# Patient Record
Sex: Male | Born: 1970 | Race: Black or African American | Hispanic: Refuse to answer | Marital: Single | Smoking: Current every day smoker
Health system: Southern US, Community
[De-identification: ages and names within clinical notes are randomized; demographics above are authoritative.]

---

## 1998-01-20 ENCOUNTER — Emergency Department (HOSPITAL_COMMUNITY): Admission: EM | Admit: 1998-01-20 | Discharge: 1998-01-20 | Payer: Self-pay | Admitting: Emergency Medicine

## 1998-01-20 ENCOUNTER — Encounter: Payer: Self-pay | Admitting: Emergency Medicine

## 1998-03-05 ENCOUNTER — Emergency Department (HOSPITAL_COMMUNITY): Admission: EM | Admit: 1998-03-05 | Discharge: 1998-03-05 | Payer: Self-pay | Admitting: Emergency Medicine

## 1998-07-21 ENCOUNTER — Emergency Department (HOSPITAL_COMMUNITY): Admission: EM | Admit: 1998-07-21 | Discharge: 1998-07-21 | Payer: Self-pay | Admitting: Emergency Medicine

## 2000-01-26 ENCOUNTER — Emergency Department (HOSPITAL_COMMUNITY): Admission: EM | Admit: 2000-01-26 | Discharge: 2000-01-26 | Payer: Self-pay | Admitting: Emergency Medicine

## 2000-01-26 ENCOUNTER — Encounter: Payer: Self-pay | Admitting: Emergency Medicine

## 2002-01-07 ENCOUNTER — Encounter: Payer: Self-pay | Admitting: Emergency Medicine

## 2002-01-07 ENCOUNTER — Emergency Department (HOSPITAL_COMMUNITY): Admission: EM | Admit: 2002-01-07 | Discharge: 2002-01-07 | Payer: Self-pay | Admitting: Emergency Medicine

## 2003-09-24 ENCOUNTER — Emergency Department (HOSPITAL_COMMUNITY): Admission: EM | Admit: 2003-09-24 | Discharge: 2003-09-24 | Payer: Self-pay | Admitting: Emergency Medicine

## 2004-02-13 ENCOUNTER — Emergency Department (HOSPITAL_COMMUNITY): Admission: EM | Admit: 2004-02-13 | Discharge: 2004-02-13 | Payer: Self-pay | Admitting: Family Medicine

## 2004-05-23 ENCOUNTER — Emergency Department (HOSPITAL_COMMUNITY): Admission: EM | Admit: 2004-05-23 | Discharge: 2004-05-24 | Payer: Self-pay | Admitting: Emergency Medicine

## 2005-07-28 ENCOUNTER — Emergency Department (HOSPITAL_COMMUNITY): Admission: EM | Admit: 2005-07-28 | Discharge: 2005-07-28 | Payer: Self-pay | Admitting: Family Medicine

## 2006-05-25 ENCOUNTER — Emergency Department (HOSPITAL_COMMUNITY): Admission: EM | Admit: 2006-05-25 | Discharge: 2006-05-25 | Payer: Self-pay | Admitting: Emergency Medicine

## 2007-05-05 ENCOUNTER — Emergency Department (HOSPITAL_COMMUNITY): Admission: EM | Admit: 2007-05-05 | Discharge: 2007-05-05 | Payer: Self-pay | Admitting: Emergency Medicine

## 2007-11-23 ENCOUNTER — Emergency Department (HOSPITAL_COMMUNITY): Admission: EM | Admit: 2007-11-23 | Discharge: 2007-11-23 | Payer: Self-pay | Admitting: Emergency Medicine

## 2007-11-29 ENCOUNTER — Emergency Department (HOSPITAL_COMMUNITY): Admission: EM | Admit: 2007-11-29 | Discharge: 2007-11-29 | Payer: Self-pay | Admitting: Emergency Medicine

## 2009-05-11 ENCOUNTER — Emergency Department (HOSPITAL_COMMUNITY): Admission: EM | Admit: 2009-05-11 | Discharge: 2009-05-11 | Payer: Self-pay | Admitting: Emergency Medicine

## 2009-11-06 ENCOUNTER — Emergency Department (HOSPITAL_COMMUNITY): Admission: EM | Admit: 2009-11-06 | Discharge: 2009-11-06 | Payer: Self-pay | Admitting: Emergency Medicine

## 2012-04-15 ENCOUNTER — Encounter (HOSPITAL_COMMUNITY): Payer: Self-pay

## 2012-04-15 ENCOUNTER — Emergency Department (HOSPITAL_COMMUNITY): Payer: Self-pay

## 2012-04-15 ENCOUNTER — Emergency Department (HOSPITAL_COMMUNITY)
Admission: EM | Admit: 2012-04-15 | Discharge: 2012-04-15 | Disposition: A | Discharge status: Home / Self Care | Payer: Self-pay | Attending: Emergency Medicine | Admitting: Emergency Medicine

## 2012-04-15 DIAGNOSIS — F17200 Nicotine dependence, unspecified, uncomplicated: Secondary | ICD-10-CM | POA: Insufficient documentation

## 2012-04-15 DIAGNOSIS — M25469 Effusion, unspecified knee: Secondary | ICD-10-CM | POA: Insufficient documentation

## 2012-04-15 DIAGNOSIS — Z79899 Other long term (current) drug therapy: Secondary | ICD-10-CM | POA: Insufficient documentation

## 2012-04-15 DIAGNOSIS — M25462 Effusion, left knee: Secondary | ICD-10-CM

## 2012-04-15 LAB — SYNOVIAL CELL COUNT + DIFF, W/ CRYSTALS: Crystals, Fluid: NONE SEEN

## 2012-04-15 LAB — GRAM STAIN

## 2012-04-15 LAB — GLUCOSE, SYNOVIAL FLUID: Glucose, Synovial Fluid: <20 mg/dL

## 2012-04-15 LAB — GLUCOSE, CAPILLARY: Glucose-Capillary: 98 mg/dL (ref 70–99)

## 2012-04-15 MED ORDER — HYDROCODONE-ACETAMINOPHEN 5-325 MG PO TABS
1.0000 | ORAL_TABLET | Freq: Once | ORAL | Status: AC
  Administered 2012-04-15: 1 via ORAL
  Filled 2012-04-15: qty 1

## 2012-04-15 MED ORDER — NAPROXEN 500 MG PO TABS: 500.0000 mg | ORAL_TABLET | Freq: Two times a day (BID) | ORAL | Status: DC

## 2012-04-15 MED ORDER — HYDROCODONE-ACETAMINOPHEN 5-325 MG PO TABS: 1.0000 | ORAL_TABLET | Freq: Four times a day (QID) | ORAL | Status: DC | PRN

## 2012-04-15 NOTE — ED Notes (Signed)
Pt returned from xray

## 2012-04-15 NOTE — ED Notes (Signed)
Pt presents with 3 day h/o L knee pain.  Pt denies any injury.  Pt reports knee is swollen, red and warm to touch.

## 2012-04-15 NOTE — ED Notes (Signed)
Ortho called 

## 2012-04-15 NOTE — ED Provider Notes (Signed)
History    CSN: [id number] Arrival date & time 04/15/12  1150 First MD Initiated Contact with Patient 04/15/12 1214      CC: knee pain  Patient is a 42 y.o. male presenting with knee pain. The history is provided by the patient.  Knee Pain This is a new problem. The current episode started more than 2 days ago. The problem occurs constantly. The problem has been gradually worsening. Pertinent negatives include no chest pain, no abdominal pain, no headaches and no shortness of breath. The symptoms are aggravated by walking. Nothing relieves the symptoms. He has tried a cold compress for the symptoms. The treatment provided no relief.  Pt noticed a small amount of redness and swelling a few days ago.  Since then time the redness has increased and progressed.  No fevers. History reviewed. No pertinent past medical history.  History reviewed. No pertinent past surgical history.  History reviewed. No pertinent family history.  History  Substance Use Topics  . Smoking status: Current Every Day Smoker -- 0.5 packs/day  . Smokeless tobacco: Not on file  . Alcohol Use: Yes      Review of Systems  Constitutional: Negative for fever.  Respiratory: Negative for shortness of breath.   Cardiovascular: Negative for chest pain.  Gastrointestinal: Negative for abdominal pain.  Skin: Negative for rash.  Neurological: Negative for headaches.  All other systems reviewed and are negative.    Allergies  Review of patient's allergies indicates no known allergies.  Home Medications  No current outpatient prescriptions on file.  BP 134/86  Pulse 70  Temp 98.2 F (36.8 C) (Oral)  Resp 18  SpO2 98%  Physical Exam  Nursing note and vitals reviewed. Constitutional: He appears well-developed and well-nourished. No distress.  HENT:  Head: Normocephalic and atraumatic.  Right Ear: External ear normal.  Left Ear: External ear normal.  Eyes: Conjunctivae normal are normal. Right eye exhibits  no discharge. Left eye exhibits no discharge. No scleral icterus.  Neck: Neck supple. No tracheal deviation present.  Cardiovascular: Normal rate and regular rhythm.   Pulmonary/Chest: Effort normal and breath sounds normal. No stridor. No respiratory distress. He has no wheezes.  Musculoskeletal: He exhibits no edema.       Left knee: He exhibits swelling and effusion. He exhibits no erythema. tenderness found.       Patient is able to flex and extend his knee although this is limited because of his discomfort, no increased warmth noted of the left knee joint, no erythema; the rest of the extremity is unremarkable  Neurological: He is alert. Cranial nerve deficit: no gross deficits.  Skin: Skin is warm and dry. No rash noted.  Psychiatric: He has a normal mood and affect.    ED Course  ARTHOCENTESIS Performed by: Celene Kras Authorized by: Celene Kras Consent: Verbal consent obtained. Written consent not obtained. Risks and benefits: risks, benefits and alternatives were discussed Consent given by: patient Patient understanding: patient states understanding of the procedure being performed Patient identity confirmed: verbally with patient Time out: Immediately prior to procedure a "time out" was called to verify the correct patient, procedure, equipment, support staff and site/side marked as required. Indications: joint swelling  Body area: knee Joint: left knee Local anesthesia used: yes Anesthesia: local infiltration Local anesthetic: lidocaine 1% without epinephrine Anesthetic total: 2 ml Patient sedated: no Preparation: Patient was prepped and draped in the usual sterile fashion. Needle gauge: 18 G Approach: medial Aspirate: yellow and clear  Aspirate amount: 25 ml Patient tolerance: Patient tolerated the procedure well with no immediate complications.    Labs Reviewed  CELL COUNT + DIFF,  W/ CRYST-SYNVL FLD - Abnormal; Notable for the following:    Color, Synovial  YELLOW (*)     Appearance-Synovial HAZY (*)     WBC, Synovial 470 (*)     All other components within normal limits  GLUCOSE, CAPILLARY  GRAM STAIN  BODY FLUID CULTURE  GLUCOSE, SYNOVIAL FLUID   Dg Knee Complete 4 Views Left  04/15/2012  *RADIOLOGY REPORT*  Clinical Data: Swelling knee pain.  Anterior knee pain.  No injury.  LEFT KNEE - COMPLETE 4+ VIEW  Comparison: None.  Findings: Anatomic alignment of the left knee.  The effusion is present.  No fracture.  Joint spaces are preserved.  IMPRESSION: Knee effusion without acute osseous abnormality.   Original Report Authenticated By: Andreas Newport, M.D.     Diagnosis: Knee effusion   MDM  Initial fluid analysis does not suggest infection.  White blood cells are noted but only 470 and no bacteria on Gram stain. No crystals noted.  Most likely related to some type of internal derangement ie meniscus.  Will dc home with crutches.  Follow up with orthopedist        Celene Kras, MD 04/15/12 1743

## 2012-04-15 NOTE — ED Notes (Signed)
Discharge instructions reviewed. Pt verbalized understanding.  

## 2012-04-15 NOTE — ED Notes (Signed)
Pt c/o pain in lt knee x 3 days. Pt states " Nothing work, I have tried ice and aspirin nothing works". Pt states he didn't injure knee. Visible swelling, and warm to touch. Pt rates pain 10/10.

## 2012-04-15 NOTE — ED Notes (Signed)
Patient transported to X-ray 

## 2012-04-15 NOTE — Progress Notes (Signed)
Orthopedic Tech Progress Note Patient Details:  Brett Holden 1970/11/08 [id number]  Ortho Devices Type of Ortho Device: Crutches;Knee Immobilizer Ortho Device/Splint Location: (L) LE Ortho Device/Splint Interventions: Application   [name] 04/15/2012, 4:44 PM

## 2012-04-18 LAB — BODY FLUID CULTURE

## 2014-10-04 ENCOUNTER — Emergency Department (HOSPITAL_COMMUNITY)
Admission: EM | Admit: 2014-10-04 | Discharge: 2014-10-04 | Disposition: A | Discharge status: Home / Self Care | Payer: 59 | Attending: Emergency Medicine | Admitting: Emergency Medicine

## 2014-10-04 ENCOUNTER — Encounter (HOSPITAL_COMMUNITY): Payer: Self-pay | Admitting: Emergency Medicine

## 2014-10-04 DIAGNOSIS — M79671 Pain in right foot: Secondary | ICD-10-CM | POA: Diagnosis present

## 2014-10-04 DIAGNOSIS — Z720 Tobacco use: Secondary | ICD-10-CM | POA: Diagnosis not present

## 2014-10-04 DIAGNOSIS — M722 Plantar fascial fibromatosis: Secondary | ICD-10-CM | POA: Diagnosis not present

## 2014-10-04 DIAGNOSIS — Z791 Long term (current) use of non-steroidal anti-inflammatories (NSAID): Secondary | ICD-10-CM | POA: Diagnosis not present

## 2014-10-04 MED ORDER — IBUPROFEN 800 MG PO TABS: 800.0000 mg | ORAL_TABLET | Freq: Three times a day (TID) | ORAL | Status: DC

## 2014-10-04 NOTE — Discharge Instructions (Signed)
Plantar Fasciitis °Plantar fasciitis is a common condition that causes foot pain. It is soreness (inflammation) of the band of tough fibrous tissue on the bottom of the foot that runs from the heel bone (calcaneus) to the ball of the foot. The cause of this soreness may be from excessive standing, poor fitting shoes, running on hard surfaces, being overweight, having an abnormal walk, or overuse (this is common in runners) of the painful foot or feet. It is also common in aerobic exercise dancers and ballet dancers. °SYMPTOMS  °Most people with plantar fasciitis complain of: °· Severe pain in the morning on the bottom of their foot especially when taking the first steps out of bed. This pain recedes after a few minutes of walking. °· Severe pain is experienced also during walking following a long period of inactivity. °· Pain is worse when walking barefoot or up stairs °DIAGNOSIS  °· Your caregiver will diagnose this condition by examining and feeling your foot. °· Special tests such as X-rays of your foot, are usually not needed. °PREVENTION  °· Consult a sports medicine professional before beginning a new exercise program. °· Walking programs offer a good workout. With walking there is a lower chance of overuse injuries common to runners. There is less impact and less jarring of the joints. °· Begin all new exercise programs slowly. If problems or pain develop, decrease the amount of time or distance until you are at a comfortable level. °· Wear good shoes and replace them regularly. °· Stretch your foot and the heel cords at the back of the ankle (Achilles tendon) both before and after exercise. °· Run or exercise on even surfaces that are not hard. For example, asphalt is better than pavement. °· Do not run barefoot on hard surfaces. °· If using a treadmill, vary the incline. °· Do not continue to workout if you have foot or joint problems. Seek professional help if they do not improve. °HOME CARE INSTRUCTIONS    °· Avoid activities that cause you pain until you recover. °· Use ice or cold packs on the problem or painful areas after working out. °· Only take over-the-counter or prescription medicines for pain, discomfort, or fever as directed by your caregiver. °· Soft shoe inserts or athletic shoes with air or gel sole cushions may be helpful. °· If problems continue or become more severe, consult a sports medicine caregiver or your own health care provider. Cortisone is a potent anti-inflammatory medication that may be injected into the painful area. You can discuss this treatment with your caregiver. °MAKE SURE YOU:  °· Understand these instructions. °· Will watch your condition. °· Will get help right away if you are not doing well or get worse. °Document Released: 11/26/2000 Document Revised: 05/26/2011 Document Reviewed: 01/26/2008 °ExitCare® Patient Information ©2015 [employer]. This information is not intended to replace advice given to you by your health care provider. Make sure you discuss any questions you have with your health care provider. ° ° °Emergency Department Resource Guide °1) Find a Doctor and Pay Out of Pocket °Although you won't have to find out who is covered by your insurance plan, it is a good idea to ask around and get recommendations. You will then need to call the office and see if the doctor you have chosen will accept you as a new patient and what types of options they offer for patients who are self-pay. Some doctors offer discounts or will set up payment plans for their patients who   do not have insurance, but you will need to ask so you aren't surprised when you get to your appointment.  2) Contact Your Local Health Department Not all health departments have doctors that can see patients for sick visits, but many do, so it is worth a call to see if yours does. If you don't know where your local health department is, you can check in your phone book. The CDC also has a tool to help you  locate your state's health department, and many state websites also have listings of all of their local health departments.  3) Find a Walk-in Clinic If your illness is not likely to be very severe or complicated, you may want to try a walk in clinic. These are popping up all over the country in pharmacies, drugstores, and shopping centers. They're usually staffed by nurse practitioners or physician assistants that have been trained to treat common illnesses and complaints. They're usually fairly quick and inexpensive. However, if you have serious medical issues or chronic medical problems, these are probably not your best option.  No Primary Care Doctor: - Call Health Connect at  [phone] - they can help you locate a primary care doctor that  accepts your insurance, provides certain services, etc. - Physician Referral Service- [phone]  Chronic Pain Problems: Organization         Address  Phone   Notes  [facility]  [phone] Patients need to be referred by their primary care doctor.   Medication Assistance: Organization         Address  Phone   Notes  [facility] [address] [place] [place], [place] [address] [phone] --Must be a resident of [facility] -- Must have NO insurance coverage whatsoever (no Medicaid/ Medicare, etc.) -- The pt. MUST have a primary care doctor that directs their care regularly and follows them in the community   [facility]  [phone]   [employer]  [phone]    Agencies that provide inexpensive medical care: Organization         Address  Phone   Notes  [facility]  [phone]   [facility]    [phone]   [facility] [address] [place], [place] [address] [phone]   [facility] [address], [place] [phone]   [facility]anned Parenthood    [phone]   [facility]    [phone]   [facility]  [address], [place] Phone:  [phone], Fax:  [phone] Hours of Operation:  9 am - 6 pm, M-F.  Also accepts Medicaid/Medicare and self-pay.  [facility]  [address], [place] Phone: [phone], Fax: [phone]. Hours of Operation:  8:30 am - 5:30 pm, M-F.  Also accepts Medicaid and self-pay.  [facility] [place] [address] [place] Phone: [phone]   [facility] [address] Natasha Bence [place], [place] [phone], Ext. [phone] Mondays & Thursdays: 7-9 AM.  First 15 patients are seen on a first come, first serve basis.    Medicaid-accepting [facility] Providers:  Organization         Address  Phone   Notes  [facility] [address], [place] [phone] Also accepts self-pay patients.  [facility]  [address], [place] ° ([phone]) [phone]   °[facility] [address], [place] (336) [phone]   °[facility] [address] [place] [address], [place] (336) [phone]   °Veita Bland [address], [place]  ° ([phone]) [phone] Only accepts [place] Access Medicaid patients after they have their name applied to their card.  ° °Self-Pay (no insurance) in [place]: ° °Organization         Address  Phone   Notes  °Sickle Cell Patients, [facility] [address], [place] (336) [phone]   °[facility] [address], [place] (336) [phone]   ° [facility] ° [address], [place] (336) [phone]   °[facility]/Dr. Osei-Bonsu ° [address] [place] [address], [place] or [address], [place] ([phone]) [phone] Phone number for both [place] and [place] locations is the same.  °[facility] [address],  [place] ([phone]) [phone]   °[facility] [address] [place] [address], [place] or [address] (336) [phone] °(336) 878-2260   °[facility] [address], [place] (336) 350-1642, phone; ([phone]) [phone], fax Sees patients 1st and 3rd Saturday of every month.  Must not qualify for public or private insurance (i.e. Medicaid, Medicare, [place] Health Choice, Veterans' Benefits) • Household income should be no more than 200% of the poverty level •The clinic cannot treat you if you are pregnant or think you are pregnant • Sexually transmitted diseases are not treated at the clinic.  ° ° °Dental Care: °Organization         Address  Phone  Notes  °[facility] [address], [place] (336) [phone] Accepts children up to age 21 who are enrolled in Medicaid or [place] Health Choice; pregnant women with a Medicaid card; and children who have applied for Medicaid or [place] Health Choice, but were declined, whose parents can pay a reduced fee at time of service.  °[facility] [place]  [address], [place] (336) 641-7733 Accepts children up to age 21 who are enrolled in Medicaid or [place] Health Choice; pregnant women with a Medicaid card; and children who have applied for Medicaid or [place] Health Choice, but were declined, whose parents can pay a reduced fee at time of service.  °[facility] ° [address], [place] (336) [phone] Patients are seen by appointment only. Walk-ins are not accepted. [facility] will see patients 18 years of age and older. °Monday - Tuesday (8am-5pm) °Most Wednesdays (8:30-5pm) °$30 per visit, cash only  °[facility] ° [address], [place] (336) [phone] Patients are seen by appointment only. Walk-ins are not accepted. [facility] will see patients 18 years of age and older. °One Wednesday Evening  (Monthly: Volunteer Based).  $30 per visit, cash only  °[facility]  ([phone]) [phone] for adults; Children under age 4, call Graduate Pediatric Dentistry at [phone]. Children aged 4-14, please call [phone]) [phone] to request a pediatric application. ° Dental services are provided in all areas of dental care including fillings, crowns and bridges, complete and partial dentures, implants, gum treatment, root canals, and extractions. Preventive care is also provided. Treatment is provided to both adults and children. °Patients   are selected via a lottery and there is often a waiting list. °  °[facility] [address], °[place] ° (336) 763-8833 [email or web address] °  °[facility] [address], [place], [place] (336)723-1848, Ext. 123 Second and Fourth Thursday of each month, opens at 6:30 AM; Clinic ends at 9 AM.  Patients are seen on a first-come first-served basis, and a limited number are seen during each clinic.  ° °[facility] ° [address], [place], [place] (336) [phone]   Eligibility Requirements °You must have lived in [place], [place], or [place] counties for at least the last three months. °  You cannot be eligible for state or federal sponsored healthcare insurance, including Veterans Administration, Medicaid, or Medicare. °  You generally cannot be eligible for healthcare insurance through your employer.  °  How to apply: °Eligibility screenings are held every Tuesday and Wednesday afternoon from 1:00 pm until 4:00 pm. You do not need an appointment for the interview!  °[facility] [address], [place], [place] 336-631-2330   °[facility]  336-342-8273   °[facility]  336-703-3100   °[facility]  336-570-6415   ° °Behavioral Health Resources in the Community: °Intensive Outpatient Programs °Organization         Address  Phone  Notes  °[place]  [facility] [address], [place], [place] 336-878-6098   °[facility] [address], [place], [place] 336-832-9800   °[facility] [address], [place], [place] ° 336-882-2125   °[facility] [address],  °[place], [place] 1-800-853-5163 or 336-641-4981   °Substance Abuse Resources °Organization         Address  Phone  Notes  °[facility]  336-882-2125   °[facility]  336-784-9470   °[facility]  336-285-9073   °[facility]  336-845-3988   °Residential & Outpatient Substance Abuse Program  1-800-659-3381   °Psychological Services °Organization         Address  Phone  Notes  °[facility]  336- 832-9600   °[facility]  336- 378-7881   °[facility] [address], [place] 1-800-853-5163 or 336-641-4981   ° °Mobile Crisis Teams °Organization         Address  Phone  Notes  °[facility], Mobile Crisis Care Unit  1-877-626-1772   °Assertive °[facility] ° [address] [place], [place] 336-834-9664   °Sharon DeEsch [address] °[place] 336-554-5454   ° °Self-Help/Support Groups °Organization         Address  Phone             Notes  °[facility] - variety of support groups  336- 373-1402 Call for more information  °Narcotics Anonymous (NA), [facility] [address], °[place]  2 meetings at this location  ° °Residential Treatment Programs °Organization         Address  Phone  Notes  °[facility] [address],    °[place]  1-866-801-8205   °[facility] ° [address], [place], [place] 704-293-8524   °[facility] [address], [place] 336-845-3988 Admissions: 8am-3pm M-F  °[facility] [address]    °[place], [place] 336-841-1104   °[facility] [address],  [place], [place] 336-379-7146   °[facility] [address]  °[place], [place] 336-285-9073   °[facility] - Intensive Outpatient [address], [place], [place] 336-852-3033   °[facility] [address]  °[place], [place] 1-877-615-2722 or 336-784-9470   °[facility] ([facility] [address] [place], [place] 336-227-7417 Accepts Medicaid  °[facility] [address]  °[place] 1-800-659-3381 Substance Abuse/Addiction Treatment  ° °[place] Behavioral Health Resources °Organization         Address  Phone  Notes  °[facility]  (888) 581-9988   °Julie Brannon, PhD [address] [place], [place]   (336) 349-5553 or (336) 951-0000   ° Behavioral   [address] °[place], [place] (336) 349-4454   °[facility] [address], [place], [place] (336) 342-8316 Insurance/Medicaid/sponsorship through [facility]  °[facility] [address]                                    [place], [place] (336) 342-8316 Therapy/tele-psych/case  °[facility] [address]  ° [place], [place] (336) 349-2233    °Dr. Arfeen  (336) 349-4544   °[facility] [place]  [employer] [place] [facility] 1) [address], [place] °2) [address], [place] °3)  [address], [place] (336) 349-3220 °(336) 342-7768 ° °(336) 342-8140   °[place] [facility] (336) 342-1394 or (336) 342-3537 (After Hours)    ° ° ° ° °

## 2014-10-04 NOTE — ED Notes (Signed)
C/o bilateral foot pain, dorsal surface. No known injury. Onset this am.

## 2014-10-04 NOTE — ED Provider Notes (Signed)
CSN: [id number]     Arrival date & time 10/04/14  [id number] History  This chart was scribed for non-physician practitioner, Jinny SandersJoseph Koa Zoeller, PA-C, working with Blane OharaJoshua Zavitz, MD by Charline BillsEssence Howell, ED Scribe. This patient was seen in room TR05C/TR05C and the patient's care was started at 10:42 AM.   Chief Complaint  Patient presents with  . Foot Pain   The history is provided by the patient. No language interpreter was used.   HPI Comments: Brett Holden is a 44 y.o. male who presents to the Emergency Department complaining of sudden onset of constant, moderate bilateral plantar foot pain since last night. Pt states that left foot pain started last night and right foot pain started this morning. Pain is exacerbated with walking. No known injury. He denies walking more often but reports standing for long periods of time for work. He also denies numbness/tingling in toes. No treatments tried PTA.   History reviewed. No pertinent past medical history. History reviewed. No pertinent past surgical history. No family history on file. History  Substance Use Topics  . Smoking status: Current Every Day Smoker -- 0.50 packs/day  . Smokeless tobacco: Not on file  . Alcohol Use: Yes    Review of Systems  Musculoskeletal: Positive for arthralgias.  Neurological: Negative for numbness.   Allergies  Review of patient's allergies indicates no known allergies.  Home Medications   Prior to Admission medications   Medication Sig Start Date End Date Taking? Authorizing Provider  gabapentin (NEURONTIN) 600 MG tablet Take 600 mg by mouth once.    Historical Provider, MD  HYDROcodone-acetaminophen (NORCO) 5-325 MG per tablet Take 1-2 tablets by mouth every 6 (six) hours as needed for pain. 04/15/12   Linwood DibblesJon Knapp, MD  ibuprofen (ADVIL,MOTRIN) 800 MG tablet Take 1 tablet (800 mg total) by mouth 3 (three) times daily. 10/04/14   Ladona MowJoe Nirali Magouirk, PA-C  naproxen (NAPROSYN) 500 MG tablet Take 1 tablet (500 mg total) by  mouth 2 (two) times daily. 04/15/12   Linwood DibblesJon Knapp, MD   BP 131/79 mmHg  Pulse 59  Temp(Src) 98.2 F (36.8 C) (Oral)  Resp 18  Ht 6' (1.829 m)  Wt 165 lb (74.844 kg)  BMI 22.37 kg/m2  SpO2 100% Physical Exam  Constitutional: He is oriented to person, place, and time. He appears well-developed and well-nourished. No distress.  HENT:  Head: Normocephalic and atraumatic.  Eyes: Conjunctivae and EOM are normal.  Neck: Neck supple. No tracheal deviation present.  Cardiovascular: Normal rate and intact distal pulses.   Pulses:      Dorsalis pedis pulses are 2+ on the right side, and 2+ on the left side.       Posterior tibial pulses are 2+ on the right side, and 2+ on the left side.  Pulmonary/Chest: Effort normal. No respiratory distress.  Musculoskeletal: Normal range of motion.  Bilaterally diffuse tenderness to the distal metatarsals on plantar aspect of foot. Distal sensation intact.   Neurological: He is alert and oriented to person, place, and time.  Skin: Skin is warm and dry.  Psychiatric: He has a normal mood and affect. His behavior is normal.  Nursing note and vitals reviewed.  ED Course  Procedures (including critical care time) DIAGNOSTIC STUDIES: Oxygen Saturation is 100% on RA, normal by my interpretation.    COORDINATION OF CARE: 10:47 AM-Discussed treatment plan which includes RICE and ibuprofen with pt at bedside and pt agreed to plan.   Labs Review Labs Reviewed - No data to  display  Imaging Review No results found.   EKG Interpretation None      MDM   Final diagnoses:  Plantar fasciitis, bilateral    Signs and symptoms consistent with plantar fasciitis. Patient stable for discharge. Patient neurovascularly intact. Hemodynamically stable, afebrile and in no acute distress. Patient stable for discharge. RICE therapy encouraged, PCP follow-up encouraged. Return precautions discussed, patient verbalizes understanding and agreement of this plan.  I  personally performed the services described in this documentation, which was scribed in my presence. The recorded information has been reviewed and is accurate.  BP 124/84 mmHg  Pulse 56  Temp(Src) 97.6 F (36.4 C) (Oral)  Resp 16  Ht 6' (1.829 m)  Wt 165 lb (74.844 kg)  BMI 22.37 kg/m2  SpO2 100%  Signed,  Ladona Mow, PA-C 5:34 PM    Ladona Mow, PA-C 10/04/14 1734  Blane Ohara, MD 10/07/14 [phone]

## 2015-05-01 ENCOUNTER — Encounter (HOSPITAL_COMMUNITY): Payer: Self-pay

## 2015-05-01 ENCOUNTER — Emergency Department (HOSPITAL_COMMUNITY)
Admission: EM | Admit: 2015-05-01 | Discharge: 2015-05-01 | Disposition: A | Discharge status: Home / Self Care | Payer: Self-pay | Attending: Emergency Medicine | Admitting: Emergency Medicine

## 2015-05-01 ENCOUNTER — Emergency Department (HOSPITAL_COMMUNITY): Payer: Self-pay

## 2015-05-01 DIAGNOSIS — Y9389 Activity, other specified: Secondary | ICD-10-CM | POA: Insufficient documentation

## 2015-05-01 DIAGNOSIS — T148XXA Other injury of unspecified body region, initial encounter: Secondary | ICD-10-CM

## 2015-05-01 DIAGNOSIS — S91331A Puncture wound without foreign body, right foot, initial encounter: Secondary | ICD-10-CM | POA: Insufficient documentation

## 2015-05-01 DIAGNOSIS — F17200 Nicotine dependence, unspecified, uncomplicated: Secondary | ICD-10-CM | POA: Insufficient documentation

## 2015-05-01 DIAGNOSIS — Z23 Encounter for immunization: Secondary | ICD-10-CM | POA: Insufficient documentation

## 2015-05-01 DIAGNOSIS — Z79899 Other long term (current) drug therapy: Secondary | ICD-10-CM | POA: Insufficient documentation

## 2015-05-01 DIAGNOSIS — Y9289 Other specified places as the place of occurrence of the external cause: Secondary | ICD-10-CM | POA: Insufficient documentation

## 2015-05-01 DIAGNOSIS — W450XXA Nail entering through skin, initial encounter: Secondary | ICD-10-CM | POA: Insufficient documentation

## 2015-05-01 DIAGNOSIS — Y999 Unspecified external cause status: Secondary | ICD-10-CM | POA: Insufficient documentation

## 2015-05-01 DIAGNOSIS — Z791 Long term (current) use of non-steroidal anti-inflammatories (NSAID): Secondary | ICD-10-CM | POA: Insufficient documentation

## 2015-05-01 MED ORDER — CEPHALEXIN 500 MG PO CAPS
500.0000 mg | ORAL_CAPSULE | Freq: Four times a day (QID) | ORAL | Status: DC
Start: 2015-05-01 — End: 2015-12-27

## 2015-05-01 MED ORDER — LEVOFLOXACIN 500 MG PO TABS: 500.0000 mg | ORAL_TABLET | Freq: Every day | ORAL | Status: DC

## 2015-05-01 MED ORDER — TETANUS-DIPHTH-ACELL PERTUSSIS 5-2.5-18.5 LF-MCG/0.5 IM SUSP
0.5000 mL | Freq: Once | INTRAMUSCULAR | Status: AC
  Administered 2015-05-01: 0.5 mL via INTRAMUSCULAR
  Filled 2015-05-01: qty 0.5

## 2015-05-01 NOTE — ED Notes (Signed)
Patient here with right foot pain after stepping on nail yesterday

## 2015-05-01 NOTE — Discharge Instructions (Signed)
Puncture Wound A puncture wound is an injury that is caused by a sharp, thin object that goes through (penetrates) your skin. Usually, a puncture wound does not leave a large opening in your skin, so it may not bleed a lot. However, when you get a puncture wound, dirt or other materials (foreign bodies) can be forced into your wound and break off inside. This increases the chance of infection, such as tetanus. CAUSES Puncture wounds are caused by any sharp, thin object that goes through your skin, such as:  Animal teeth, as with an animal bite.  Sharp, pointed objects, such as nails, splinters of glass, fishhooks, and needles. SYMPTOMS Symptoms of a puncture wound include:  Pain.  Bleeding.  Swelling.  Bruising.  Fluid leaking from the wound.  Numbness, tingling, or loss of function. DIAGNOSIS This condition is diagnosed with a medical history and physical exam. Your wound will be checked to see if it contains any foreign bodies. You may also have X-rays or other imaging tests. TREATMENT Treatment for a puncture wound depends on how serious the wound is. It also depends on whether the wound contains any foreign bodies. Treatment for all types of puncture wounds usually starts with:  Controlling the bleeding.  Washing out the wound with a germ-free (sterile) salt-water solution.  Checking the wound for foreign bodies. Treatment may also include:  Having the wound opened surgically to remove a foreign object.  Closing the wound with stitches (sutures) if it continues to bleed.  Covering the wound with antibiotic ointments and a bandage (dressing).  Receiving a tetanus shot.  Receiving a rabies vaccine. HOME CARE INSTRUCTIONS Medicines  Take or apply over-the-counter and prescription medicines only as told by your health care provider.  If you were prescribed an antibiotic, take or apply it as told by your health care provider. Do not stop using the antibiotic even if  your condition improves. Wound Care  There are many ways to close and cover a wound. For example, a wound can be covered with sutures, skin glue, or adhesive strips. Follow instructions from your health care provider about:  How to take care of your wound.  When and how you should change your dressing.  When you should remove your dressing.  Removing whatever was used to close your wound.  Keep the dressing dry as told by your health care provider. Do not take baths, swim, use a hot tub, or do anything that would put your wound underwater until your health care provider approves.  Clean the wound as told by your health care provider.  Do not scratch or pick at the wound.  Check your wound every day for signs of infection. Watch for:  Redness, swelling, or pain.  Fluid, blood, or pus. General Instructions  Raise (elevate) the injured area above the level of your heart while you are sitting or lying down.  If your puncture wound is in your foot, ask your health care provider if you need to avoid putting weight on your foot and for how long.  Keep all follow-up visits as told by your health care provider. This is important. SEEK MEDICAL CARE IF:  You received a tetanus shot and you have swelling, severe pain, redness, or bleeding at the injection site.  You have a fever.  Your sutures come out.  You notice a bad smell coming from your wound or your dressing.  You notice something coming out of your wound, such as wood or glass.  Your   pain is not controlled with medicine.  You have increased redness, swelling, or pain at the site of your wound.  You have fluid, blood, or pus coming from your wound.  You notice a change in the color of your skin near your wound.  You need to change the dressing frequently due to fluid, blood, or pus draining from your wound.  You develop a new rash.  You develop numbness around your wound. SEEK IMMEDIATE MEDICAL CARE IF:  You  develop severe swelling around your wound.  Your pain suddenly increases and is severe.  You develop painful skin lumps.  You have a red streak going away from your wound.  The wound is on your hand or foot and you cannot properly move a finger or toe.  The wound is on your hand or foot and you notice that your fingers or toes look pale or bluish.   This information is not intended to replace advice given to you by your health care provider. Make sure you discuss any questions you have with your health care provider.   Document Released: 12/11/2004 Document Revised: 11/22/2014 Document Reviewed: 04/26/2014 Elsevier Interactive Patient Education 2016 [employer]  

## 2015-05-01 NOTE — ED Provider Notes (Signed)
CSN: [id number]     Arrival date & time 05/01/15  1101 History  By signing my name below, I, Freida Busman, attest that this documentation has been prepared under the direction and in the presence of non-physician practitioner, Roxy Horseman, PA-C. Electronically Signed: Freida Busman, Scribe. 05/01/2015. 12:00 PM.    Chief Complaint  Patient presents with  . stepped on nail    The history is provided by the patient. No language interpreter was used.     HPI Comments:  BASEM YANNUZZI is a 45 y.o. male who presents to the Emergency Department complaining of moderate pain to his right foot following injury yesterday ~ 1900. Pt notes he stepped on dirty small nail ~ 1500 yesterday. No alleviating factors noted. Tetanus is not UTD. He denies h/o DM. Denies fevers or chills.  Worse with palpation and movement.   History reviewed. No pertinent past medical history. History reviewed. No pertinent past surgical history. No family history on file. Social History  Substance Use Topics  . Smoking status: Current Every Day Smoker -- 0.50 packs/day  . Smokeless tobacco: None  . Alcohol Use: Yes    Review of Systems  Constitutional: Negative for fever and chills.  Respiratory: Negative for shortness of breath.   Cardiovascular: Negative for chest pain.  Musculoskeletal: Positive for myalgias.  Skin: Positive for wound.    Allergies  Review of patient's allergies indicates no known allergies.  Home Medications   Prior to Admission medications   Medication Sig Start Date End Date Taking? Authorizing Provider  gabapentin (NEURONTIN) 600 MG tablet Take 600 mg by mouth once.    Historical Provider, MD  HYDROcodone-acetaminophen (NORCO) 5-325 MG per tablet Take 1-2 tablets by mouth every 6 (six) hours as needed for pain. 04/15/12   Linwood Dibbles, MD  ibuprofen (ADVIL,MOTRIN) 800 MG tablet Take 1 tablet (800 mg total) by mouth 3 (three) times daily. 10/04/14   Ladona Mow, PA-C  naproxen  (NAPROSYN) 500 MG tablet Take 1 tablet (500 mg total) by mouth 2 (two) times daily. 04/15/12   Linwood Dibbles, MD   BP 132/91 mmHg  Pulse 81  Temp(Src) 98.2 F (36.8 C) (Oral)  Resp 18  Ht 6' (1.829 m)  Wt 167 lb 8 oz (75.978 kg)  BMI 22.71 kg/m2  SpO2 98% Physical Exam Physical Exam  Constitutional: Pt appears well-developed and well-nourished. No distress.  HENT:  Head: Normocephalic and atraumatic.  Eyes: Conjunctivae are normal.  Neck: Normal range of motion.  Cardiovascular: Normal rate, regular rhythm and intact distal pulses.   Capillary refill < 3 sec  Pulmonary/Chest: Effort normal and breath sounds normal.  Musculoskeletal: Pt exhibits tenderness. Pt exhibits no edema.  ROM: 5/5  Neurological: Pt  is alert. Coordination normal.  Sensation 5/5 Strength 5/5  Skin: Skin is warm and dry. Pt is not diaphoretic.  No tenting of the skin . There is mild erythema about the lateral aspect of right foot and over the 4th and 5th metatarsal. No abscess or discharge  Psychiatric: Pt has a normal mood and affect.  Nursing note and vitals reviewed.  ED Course  Procedures   DIAGNOSTIC STUDIES:  Oxygen Saturation is 98% on RA, normal by my interpretation.    COORDINATION OF CARE:  11:49 AM Will order XR of right foot, update tetanus and discharge antibiotic. Discussed treatment plan with pt at bedside and pt agreed to plan.  Imaging Review Dg Foot Complete Right  05/01/2015  CLINICAL DATA:  Recently stepped on  nail with pain and swelling, initial encounter EXAM: RIGHT FOOT COMPLETE - 3+ VIEW COMPARISON:  None. FINDINGS: There is no evidence of fracture or dislocation. There is no evidence of arthropathy or other focal bone abnormality. Soft tissues are unremarkable. No radiopaque foreign body is seen. IMPRESSION: No acute abnormality noted. Electronically Signed   By: Alcide Clever M.D.   On: 05/01/2015 12:21   I have personally reviewed and evaluated these images and lab results as  part of my medical decision-making.    MDM   Final diagnoses:  Puncture wound    Will treat with levaquin and keflex to cover pseudomonas.   Return precautions given.  Plain films are negative.  No sign of abscess.    I personally performed the services described in this documentation, which was scribed in my presence. The recorded information has been reviewed and is accurate.      Roxy Horseman, PA-C 05/01/15 1342  Blane Ohara, MD 05/01/15 1344

## 2015-11-23 ENCOUNTER — Encounter (HOSPITAL_COMMUNITY): Payer: Self-pay | Admitting: Emergency Medicine

## 2015-11-23 ENCOUNTER — Emergency Department (HOSPITAL_COMMUNITY)
Admission: EM | Admit: 2015-11-23 | Discharge: 2015-11-23 | Disposition: A | Discharge status: Home / Self Care | Payer: Self-pay | Attending: Emergency Medicine | Admitting: Emergency Medicine

## 2015-11-23 ENCOUNTER — Emergency Department (HOSPITAL_COMMUNITY): Payer: Self-pay

## 2015-11-23 DIAGNOSIS — M25561 Pain in right knee: Secondary | ICD-10-CM

## 2015-11-23 DIAGNOSIS — F17200 Nicotine dependence, unspecified, uncomplicated: Secondary | ICD-10-CM | POA: Insufficient documentation

## 2015-11-23 DIAGNOSIS — M25461 Effusion, right knee: Secondary | ICD-10-CM | POA: Insufficient documentation

## 2015-11-23 MED ORDER — HYDROCODONE-ACETAMINOPHEN 5-325 MG PO TABS: 1.0000 | ORAL_TABLET | Freq: Four times a day (QID) | ORAL | 0 refills | Status: DC | PRN

## 2015-11-23 MED ORDER — IBUPROFEN 600 MG PO TABS: 600.0000 mg | ORAL_TABLET | Freq: Four times a day (QID) | ORAL | 0 refills | Status: DC | PRN

## 2015-11-23 NOTE — ED Provider Notes (Signed)
[facility]-EMERGENCY DEPT Provider Note   CSN: [id number] Arrival date & time: 11/23/15  0809     History   Chief Complaint Chief Complaint  Patient presents with  . Knee Pain    HPI Brett Holden is a 45 y.o. male.  Patient presents to the emergency department with chief complaint of right knee pain. He states that he has been having the pain for about the past month. 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 He states that commonly he is on his hands and knees, and gets "stuck." He reports associated locking, clicking, popping of his right knee. He reports intermittent swelling, which has improved, but is still present. His symptoms are worsened with movement and palpation. There are no other associated symptoms. He denies any fevers or chills. He is ambulatory.   The history is provided by the patient. No language interpreter was used.    History reviewed. No pertinent past medical history.  There are no active problems to display for this patient.   History reviewed. No pertinent surgical history.     Home Medications    Prior to Admission medications   Medication Sig Start Date End Date Taking? Authorizing Provider  cephALEXin (KEFLEX) 500 MG capsule Take 1 capsule (500 mg total) by mouth 4 (four) times daily. 05/01/15   Roxy Horsemanobert Shareta Fishbaugh, PA-C  gabapentin (NEURONTIN) 600 MG tablet Take 600 mg by mouth once.    Historical Provider, MD  HYDROcodone-acetaminophen (NORCO) 5-325 MG per tablet Take 1-2 tablets by mouth every 6 (six) hours as needed for pain. 04/15/12   Linwood DibblesJon Knapp, MD  ibuprofen (ADVIL,MOTRIN) 800 MG tablet Take 1 tablet (800 mg total) by mouth 3 (three) times daily. 10/04/14   Ladona MowJoe Mintz, PA-C  levofloxacin (LEVAQUIN) 500 MG tablet Take 1 tablet (500 mg total) by mouth daily. 05/01/15   Roxy Horsemanobert Margrette Wynia, PA-C  naproxen (NAPROSYN) 500 MG tablet Take 1 tablet (500 mg total) by mouth 2 (two) times daily. 04/15/12   Linwood DibblesJon Knapp, MD    Family  History History reviewed. No pertinent family history.  Social History Social History  Substance Use Topics  . Smoking status: Current Every Day Smoker    Packs/day: 0.50  . Smokeless tobacco: Never Used  . Alcohol use 1.2 oz/week    2 Cans of beer per week     Comment: daily     Allergies   Review of patient's allergies indicates no known allergies.   Review of Systems Review of Systems  Musculoskeletal: Positive for arthralgias.  All other systems reviewed and are negative.    Physical Exam Updated Vital Signs BP (!) 139/101 (BP Location: Right Arm)   Pulse 68   Temp 98.5 F (36.9 C) (Oral)   Resp 16   Ht 6' (1.829 m)   Wt 77.1 kg   SpO2 98%   BMI 23.06 kg/m   Physical Exam  Constitutional: He is oriented to person, place, and time. He appears well-developed and well-nourished.  HENT:  Head: Normocephalic and atraumatic.  Eyes: Conjunctivae and EOM are normal. Pupils are equal, round, and reactive to light. Right eye exhibits no discharge. Left eye exhibits no discharge. No scleral icterus.  Neck: Normal range of motion. Neck supple. No JVD present.  Cardiovascular: Normal rate, regular rhythm and normal heart sounds.  Exam reveals no gallop and no friction rub.   No murmur heard. Pulmonary/Chest: Effort normal and breath sounds normal. No respiratory distress. He has no wheezes. He has no  rales. He exhibits no tenderness.  Abdominal: Soft. He exhibits no distension and no mass. There is no tenderness. There is no rebound and no guarding.  Musculoskeletal: Normal range of motion. He exhibits no edema or tenderness.  Range of motion 4/5 Strength 5/5 Knee stability testing limited secondary to guarding Mild effusion  Neurological: He is alert and oriented to person, place, and time.  Skin: Skin is warm and dry.  No erythema  Psychiatric: He has a normal mood and affect. His behavior is normal. Judgment and thought content normal.  Nursing note and vitals  reviewed.    ED Treatments / Results  Labs (all labs ordered are listed, but only abnormal results are displayed) Labs Reviewed - No data to display  EKG  EKG Interpretation None       Radiology No results found.  Procedures Procedures (including critical care time)  Medications Ordered in ED Medications - No data to display   Initial Impression / Assessment and Plan / ED Course  I have reviewed the triage vital signs and the nursing notes.  Pertinent labs & imaging results that were available during my care of the patient were reviewed by me and considered in my medical decision making (see chart for details).  Clinical Course    Patient with right knee pain. He has had the pain for the past month. He reports associated locking and clicking. Pain is mostly located over the medial joint lines. I'm suspicious for meniscus injury. Doubt septic joint. Patient is afebrile. Is able to range the knee with only limitations at the extreme ends.  The symptoms have been ongoing for a month.  He is well appearing.  Will check plain films.   Plan on ortho follow-up.  Plain films are negative. No knee effusion. Also consider pes anserine bursitis/tendinitis.  Knee immobilizer and crutches, ortho follow-up. Final Clinical Impressions(s) / ED Diagnoses   Final diagnoses:  Right knee pain    New Prescriptions New Prescriptions   HYDROCODONE-ACETAMINOPHEN (NORCO/VICODIN) 5-325 MG TABLET    Take 1-2 tablets by mouth every 6 (six) hours as needed.   IBUPROFEN (ADVIL,MOTRIN) 600 MG TABLET    Take 1 tablet (600 mg total) by mouth every 6 (six) hours as needed.     Roxy Horseman, PA-C 11/23/15 1000    Loren Racer, MD 11/28/15 [phone]

## 2015-11-23 NOTE — ED Notes (Signed)
Pt. Home. Stable. after Knee immobilizer applied & with crutches. States understanding of instruction. Discharging with friend.

## 2015-11-23 NOTE — ED Triage Notes (Addendum)
Pt. Arrives c/o pain in right knee over last month. Swelling in right knee has improved but not resolved. Right knee warmer to touch than left. Distal pulses bilaterally positive. Pt. Denies injury.

## 2015-11-23 NOTE — Progress Notes (Signed)
Orthopedic Tech Progress Note Patient Details:  Vira BrownsChristopher R Pulsifer 1971/02/23 [id number]  Ortho Devices Type of Ortho Device: Crutches, Knee Immobilizer Ortho Device/Splint Location: rle Ortho Device/Splint Interventions: Application   Quintez Maselli 11/23/2015, 10:23 AM

## 2015-11-23 NOTE — ED Notes (Signed)
Patient transported to X-ray 

## 2015-12-27 ENCOUNTER — Emergency Department (HOSPITAL_COMMUNITY)
Admission: EM | Admit: 2015-12-27 | Discharge: 2015-12-27 | Disposition: A | Discharge status: Home / Self Care | Payer: Self-pay | Attending: Emergency Medicine | Admitting: Emergency Medicine

## 2015-12-27 ENCOUNTER — Emergency Department (HOSPITAL_BASED_OUTPATIENT_CLINIC_OR_DEPARTMENT_OTHER)
Admit: 2015-12-27 | Discharge: 2015-12-27 | Disposition: A | Discharge status: Home / Self Care | Payer: 59 | Attending: Emergency Medicine | Admitting: Emergency Medicine

## 2015-12-27 ENCOUNTER — Encounter (HOSPITAL_COMMUNITY): Payer: Self-pay | Admitting: Emergency Medicine

## 2015-12-27 DIAGNOSIS — G8929 Other chronic pain: Secondary | ICD-10-CM | POA: Insufficient documentation

## 2015-12-27 DIAGNOSIS — T148XXA Other injury of unspecified body region, initial encounter: Secondary | ICD-10-CM

## 2015-12-27 DIAGNOSIS — M25462 Effusion, left knee: Secondary | ICD-10-CM | POA: Insufficient documentation

## 2015-12-27 DIAGNOSIS — M79609 Pain in unspecified limb: Secondary | ICD-10-CM

## 2015-12-27 DIAGNOSIS — Y999 Unspecified external cause status: Secondary | ICD-10-CM | POA: Insufficient documentation

## 2015-12-27 DIAGNOSIS — Y939 Activity, unspecified: Secondary | ICD-10-CM | POA: Insufficient documentation

## 2015-12-27 DIAGNOSIS — M25561 Pain in right knee: Secondary | ICD-10-CM | POA: Insufficient documentation

## 2015-12-27 DIAGNOSIS — K0889 Other specified disorders of teeth and supporting structures: Secondary | ICD-10-CM | POA: Insufficient documentation

## 2015-12-27 DIAGNOSIS — M2540 Effusion, unspecified joint: Secondary | ICD-10-CM

## 2015-12-27 DIAGNOSIS — X58XXXA Exposure to other specified factors, initial encounter: Secondary | ICD-10-CM | POA: Insufficient documentation

## 2015-12-27 DIAGNOSIS — Y929 Unspecified place or not applicable: Secondary | ICD-10-CM | POA: Insufficient documentation

## 2015-12-27 DIAGNOSIS — S86912A Strain of unspecified muscle(s) and tendon(s) at lower leg level, left leg, initial encounter: Secondary | ICD-10-CM | POA: Insufficient documentation

## 2015-12-27 DIAGNOSIS — F17200 Nicotine dependence, unspecified, uncomplicated: Secondary | ICD-10-CM | POA: Insufficient documentation

## 2015-12-27 DIAGNOSIS — K089 Disorder of teeth and supporting structures, unspecified: Secondary | ICD-10-CM

## 2015-12-27 MED ORDER — AMOXICILLIN 500 MG PO CAPS: 500.0000 mg | ORAL_CAPSULE | Freq: Three times a day (TID) | ORAL | 0 refills | Status: DC

## 2015-12-27 MED ORDER — NAPROXEN 500 MG PO TABS: 500.0000 mg | ORAL_TABLET | Freq: Two times a day (BID) | ORAL | 0 refills | Status: DC

## 2015-12-27 MED ORDER — TRAMADOL HCL 50 MG PO TABS: 50.0000 mg | ORAL_TABLET | Freq: Four times a day (QID) | ORAL | 0 refills | Status: DC | PRN

## 2015-12-27 NOTE — ED Notes (Signed)
Patient d/c'd from continuous pulse oximetry and blood pressure cuff; patient already dressed; waiting for discharge paperwork

## 2015-12-27 NOTE — ED Triage Notes (Signed)
Pt arrives via POV from home with right knee pain for the last month. For the past week with right calf pain and swelling.

## 2015-12-27 NOTE — Progress Notes (Signed)
*  Preliminary Results* Right lower extremity venous duplex completed. Right lower extremity is negative for deep vein thrombosis. There is no evidence of right Baker's cyst.  Incidental finding: there is an anechoic area on the anterior proximal aspect of the leg, and the medial proximal calf exhibits a heterogenous area of the muscle; etiology of these findings is unknown.  Preliminary results discussed with Dr. Fayrene FearingJames.  12/27/2015 9:21 AM  Gertie FeyMichelle Yafet Cline, BS, RVT, RDCS, RDMS

## 2015-12-27 NOTE — Discharge Instructions (Signed)
Follow-up with orthopedics as soon as you are able.  I recommend a compressive knee sleeve to limit swelling and pain of your knee.  I recommend you wear compression stockings to limit swelling of your lower leg.

## 2015-12-27 NOTE — ED Notes (Signed)
Patient transported to vascular. 

## 2015-12-27 NOTE — ED Notes (Signed)
Dr. James at bedside  

## 2015-12-27 NOTE — ED Provider Notes (Signed)
[facility] Provider Note   CSN: [id number] Arrival date & time: 12/27/15  [id number]     History   Chief Complaint Chief Complaint  Patient presents with  . Leg Pain    HPI Brett Holden is a 45 y.o. male. He presents evaluation knee pain, right lower leg pain, and tooth pain. States he was seen a few weeks ago with the knee pain. Was told he might have a cartilage injury. 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 He has been limping. He was wearing an Ace wrap which seems to help a bit. The knee has become more swollen. Some tenderness and pain and swelling in the right lower leg. Also has a left mandibular third molar that is painful and broken. He states that he is awaiting insurance for the new sign it. After his new job started. This is November 1.  HPI  History reviewed. No pertinent past medical history.  There are no active problems to display for this patient.   History reviewed. No pertinent surgical history.     Home Medications    Prior to Admission medications   Medication Sig Start Date End Date Taking? Authorizing Provider  amoxicillin (AMOXIL) 500 MG capsule Take 1 capsule (500 mg total) by mouth 3 (three) times daily. 12/27/15   Rolland PorterMark Giovonnie Trettel, MD  naproxen (NAPROSYN) 500 MG tablet Take 1 tablet (500 mg total) by mouth 2 (two) times daily. 12/27/15   Rolland PorterMark Melrose Kearse, MD  traMADol (ULTRAM) 50 MG tablet Take 1 tablet (50 mg total) by mouth every 6 (six) hours as needed. 12/27/15   Rolland PorterMark Brannen Koppen, MD    Family History History reviewed. No pertinent family history.  Social History Social History  Substance Use Topics  . Smoking status: Current Every Day Smoker    Packs/day: 0.50  . Smokeless tobacco: Never Used  . Alcohol use 1.2 oz/week    2 Cans of beer per week     Comment: daily     Allergies   Review of patient's allergies indicates no known allergies.   Review of Systems Review of Systems  Constitutional: Negative for appetite  change, chills, diaphoresis, fatigue and fever.  HENT: Positive for dental problem. Negative for mouth sores, sore throat and trouble swallowing.   Eyes: Negative for visual disturbance.  Respiratory: Negative for cough, chest tightness, shortness of breath and wheezing.   Cardiovascular: Negative for chest pain.  Gastrointestinal: Negative for abdominal distention, abdominal pain, diarrhea, nausea and vomiting.  Endocrine: Negative for polydipsia, polyphagia and polyuria.  Genitourinary: Negative for dysuria, frequency and hematuria.  Musculoskeletal: Positive for arthralgias, gait problem, joint swelling and myalgias.  Skin: Negative for color change, pallor and rash.  Neurological: Negative for dizziness, syncope, light-headedness and headaches.  Hematological: Does not bruise/bleed easily.  Psychiatric/Behavioral: Negative for behavioral problems and confusion.     Physical Exam Updated Vital Signs BP 131/87 (BP Location: Right Arm)   Pulse 76   Resp 18   SpO2 100%   Physical Exam  Constitutional: He is oriented to person, place, and time. He appears well-developed and well-nourished. No distress.  HENT:  Head: Normocephalic.  Mouth/Throat:    Eyes: Conjunctivae are normal. Pupils are equal, round, and reactive to light. No scleral icterus.  Neck: Normal range of motion. Neck supple. No thyromegaly present.  Cardiovascular: Normal rate and regular rhythm.  Exam reveals no gallop and no friction rub.   No murmur heard. Pulmonary/Chest: Effort normal and breath sounds normal.  No respiratory distress. He has no wheezes. He has no rales.  Abdominal: Soft. Bowel sounds are normal. He exhibits no distension. There is no tenderness. There is no rebound.  Musculoskeletal: Normal range of motion.       Legs: Neurological: He is alert and oriented to person, place, and time.  Skin: Skin is warm and dry. No rash noted.  Psychiatric: He has a normal mood and affect. His behavior is  normal.     ED Treatments / Results  Labs (all labs ordered are listed, but only abnormal results are displayed) Labs Reviewed - No data to display  EKG  EKG Interpretation None       Radiology No results found.  Procedures Procedures (including critical care time)  Medications Ordered in ED Medications - No data to display   Initial Impression / Assessment and Plan / ED Course  I have reviewed the triage vital signs and the nursing notes.  Pertinent labs & imaging results that were available during my care of the patient were reviewed by me and considered in my medical decision making (see chart for details).  Clinical Course    Ultrasound shows some periventricular fluid adjacent the left knee. He has no DVT. No SVT. He has no persist. He is advised use a knee sleeve and compression hose. He will need orthopedics. Given Amoxil and first tooth. Dental follow-up. Latex follow-up.  Final Clinical Impressions(s) / ED Diagnoses   Final diagnoses:  Acute pain of right knee  Joint effusion  Muscle strain  Chronic dental pain    New Prescriptions New Prescriptions   AMOXICILLIN (AMOXIL) 500 MG CAPSULE    Take 1 capsule (500 mg total) by mouth 3 (three) times daily.   NAPROXEN (NAPROSYN) 500 MG TABLET    Take 1 tablet (500 mg total) by mouth 2 (two) times daily.   TRAMADOL (ULTRAM) 50 MG TABLET    Take 1 tablet (50 mg total) by mouth every 6 (six) hours as needed.     Rolland Porter, MD 12/27/15 1030

## 2015-12-27 NOTE — ED Notes (Signed)
Patient has returned from being out of the department; placed back on continuous pulse oximetry and blood pressure cuff

## 2016-04-23 ENCOUNTER — Emergency Department (HOSPITAL_COMMUNITY)
Admission: EM | Admit: 2016-04-23 | Discharge: 2016-04-23 | Disposition: A | Discharge status: Home / Self Care | Payer: 59 | Attending: Emergency Medicine | Admitting: Emergency Medicine

## 2016-04-23 ENCOUNTER — Encounter (HOSPITAL_COMMUNITY): Payer: Self-pay | Admitting: *Deleted

## 2016-04-23 DIAGNOSIS — F1910 Other psychoactive substance abuse, uncomplicated: Secondary | ICD-10-CM | POA: Insufficient documentation

## 2016-04-23 DIAGNOSIS — F17200 Nicotine dependence, unspecified, uncomplicated: Secondary | ICD-10-CM | POA: Insufficient documentation

## 2016-04-23 DIAGNOSIS — E162 Hypoglycemia, unspecified: Secondary | ICD-10-CM | POA: Insufficient documentation

## 2016-04-23 DIAGNOSIS — Z79899 Other long term (current) drug therapy: Secondary | ICD-10-CM | POA: Insufficient documentation

## 2016-04-23 LAB — CBC WITH DIFFERENTIAL/PLATELET
BASOS PCT: 0 %
Basophils Absolute: 0 10*3/uL (ref 0.0–0.1)
EOS ABS: 0 10*3/uL (ref 0.0–0.7)
EOS PCT: 0 %
HEMATOCRIT: 43.3 % (ref 39.0–52.0)
HEMOGLOBIN: 14.4 g/dL (ref 13.0–17.0)
Lymphocytes Relative: 11 %
Lymphs Abs: 1.9 10*3/uL (ref 0.7–4.0)
MCH: 32.7 pg (ref 26.0–34.0)
MCHC: 33.3 g/dL (ref 30.0–36.0)
MCV: 98.4 fL (ref 78.0–100.0)
MONOS PCT: 7 %
Monocytes Absolute: 1.2 10*3/uL — ABNORMAL HIGH (ref 0.1–1.0)
NEUTROS ABS: 14.4 10*3/uL — AB (ref 1.7–7.7)
NEUTROS PCT: 82 %
Platelets: 314 10*3/uL (ref 150–400)
RBC: 4.4 MIL/uL (ref 4.22–5.81)
RDW: 13.5 % (ref 11.5–15.5)
WBC: 17.5 10*3/uL — ABNORMAL HIGH (ref 4.0–10.5)

## 2016-04-23 LAB — RAPID URINE DRUG SCREEN, HOSP PERFORMED
AMPHETAMINES: NOT DETECTED
Barbiturates: NOT DETECTED
Benzodiazepines: NOT DETECTED
Cocaine: POSITIVE — AB
Opiates: NOT DETECTED
TETRAHYDROCANNABINOL: POSITIVE — AB

## 2016-04-23 LAB — COMPREHENSIVE METABOLIC PANEL
ALK PHOS: 65 U/L (ref 38–126)
ALT: 58 U/L (ref 17–63)
AST: 78 U/L — ABNORMAL HIGH (ref 15–41)
Albumin: 4.9 g/dL (ref 3.5–5.0)
Anion gap: 16 — ABNORMAL HIGH (ref 5–15)
BILIRUBIN TOTAL: 0.3 mg/dL (ref 0.3–1.2)
BUN: 11 mg/dL (ref 6–20)
CALCIUM: 9.3 mg/dL (ref 8.9–10.3)
CO2: 20 mmol/L — AB (ref 22–32)
CREATININE: 1.32 mg/dL — AB (ref 0.61–1.24)
Chloride: 105 mmol/L (ref 101–111)
GFR calc non Af Amer: >60 mL/min (ref >60)
GLUCOSE: 53 mg/dL — AB (ref 65–99)
Potassium: 4.1 mmol/L (ref 3.5–5.1)
SODIUM: 141 mmol/L (ref 135–145)
TOTAL PROTEIN: 8.1 g/dL (ref 6.5–8.1)

## 2016-04-23 LAB — SALICYLATE LEVEL

## 2016-04-23 LAB — ETHANOL: Alcohol, Ethyl (B): 78 mg/dL — ABNORMAL HIGH (ref <5)

## 2016-04-23 LAB — CBG MONITORING, ED: GLUCOSE-CAPILLARY: 86 mg/dL (ref 65–99)

## 2016-04-23 LAB — ACETAMINOPHEN LEVEL

## 2016-04-23 MED ORDER — ONDANSETRON HCL 4 MG/2ML IJ SOLN
4.0000 mg | Freq: Once | INTRAMUSCULAR | Status: AC
  Administered 2016-04-23: 4 mg via INTRAVENOUS

## 2016-04-23 MED ORDER — ONDANSETRON HCL 4 MG/2ML IJ SOLN
4.0000 mg | Freq: Once | INTRAMUSCULAR | Status: AC
  Administered 2016-04-23: 4 mg via INTRAVENOUS
  Filled 2016-04-23: qty 2

## 2016-04-23 MED ORDER — SODIUM CHLORIDE 0.9 % IV BOLUS (SEPSIS)
1000.0000 mL | Freq: Once | INTRAVENOUS | Status: AC
  Administered 2016-04-23: 1000 mL via INTRAVENOUS

## 2016-04-23 MED ORDER — DEXTROSE 50 % IV SOLN
50.0000 mL | Freq: Once | INTRAVENOUS | Status: AC
  Administered 2016-04-23: 50 mL via INTRAVENOUS
  Filled 2016-04-23: qty 50

## 2016-04-23 NOTE — ED Notes (Signed)
Continue to remind patient need for urine

## 2016-04-23 NOTE — ED Notes (Signed)
Attempting to get ua.

## 2016-04-23 NOTE — ED Notes (Signed)
Patient is waiting on mother to bring him his clothes

## 2016-04-23 NOTE — ED Notes (Signed)
Patient  Sitting up on stretcher much more alert states he feels better , ginger ale given

## 2016-04-23 NOTE — ED Provider Notes (Signed)
[facility] Provider Note   CSN: [id number] Arrival date & time: 04/23/16  0813     History   Chief Complaint Chief Complaint  Patient presents with  . Drug Overdose    HPI Brett Holden is a 46 y.o. male.  HPI Patient brought in by EMS after suspected overdose. Found by patient's mother to be unresponsive this morning and EMS was called. Given Narcan en route with improvement of alertness. Patient states that he smoked a white unknown substance yesterday evening. He's had several alcoholic drinks. Denies any other ingestion. Vomiting in the emergency department. Denies chest pain or abdominal pain. History reviewed. No pertinent past medical history.  There are no active problems to display for this patient.   History reviewed. No pertinent surgical history.     Home Medications    Prior to Admission medications   Medication Sig Start Date End Date Taking? Authorizing Provider  naproxen (NAPROSYN) 500 MG tablet Take 1 tablet (500 mg total) by mouth 2 (two) times daily. Patient not taking: Reported on 04/23/2016 12/27/15   Rolland PorterMark James, MD  traMADol (ULTRAM) 50 MG tablet Take 1 tablet (50 mg total) by mouth every 6 (six) hours as needed. Patient not taking: Reported on 04/23/2016 12/27/15   Rolland PorterMark James, MD    Family History History reviewed. No pertinent family history.  Social History Social History  Substance Use Topics  . Smoking status: Current Every Day Smoker    Packs/day: 0.50  . Smokeless tobacco: Never Used  . Alcohol use 1.2 oz/week    2 Cans of beer per week     Comment: daily     Allergies   Patient has no known allergies.   Review of Systems Review of Systems  Constitutional: Negative for chills and fever.  Respiratory: Negative for shortness of breath.   Cardiovascular: Negative for chest pain.  Gastrointestinal: Positive for nausea and vomiting. Negative for abdominal pain and diarrhea.  Musculoskeletal: Negative for arthralgias,  myalgias, neck pain and neck stiffness.  Skin: Negative for rash and wound.  Neurological: Negative for dizziness, weakness, light-headedness and numbness.  All other systems reviewed and are negative.    Physical Exam Updated Vital Signs BP 120/97 (BP Location: Left Arm)   Pulse 100   Temp 98.2 F (36.8 C) (Oral)   Resp 14   Ht 6' (1.829 m)   Wt 172 lb (78 kg)   SpO2 100%   BMI 23.33 kg/m   Physical Exam  Constitutional: He is oriented to person, place, and time. He appears well-developed and well-nourished.  HENT:  Head: Normocephalic and atraumatic.  Mouth/Throat: Oropharynx is clear and moist. No oropharyngeal exudate.  Eyes: EOM are normal. Pupils are equal, round, and reactive to light.  Pupils 3 mm and minimally reactive  Neck: Normal range of motion. Neck supple.  Cardiovascular: Normal rate and regular rhythm.  Exam reveals no gallop and no friction rub.   No murmur heard. Pulmonary/Chest: Effort normal and breath sounds normal. No respiratory distress. He has no wheezes. He has no rales. He exhibits no tenderness.  Abdominal: Soft. Bowel sounds are normal. There is no tenderness. There is no rebound and no guarding.  Musculoskeletal: Normal range of motion. He exhibits no edema or tenderness.  Neurological: He is alert and oriented to person, place, and time.  Awake and alert. Moving all extremities without deficit. Sensation intact.  Skin: Skin is warm and dry. Capillary refill takes less than 2 seconds. No rash noted. No  erythema.  Psychiatric: He has a normal mood and affect. His behavior is normal.  Nursing note and vitals reviewed.    ED Treatments / Results  Labs (all labs ordered are listed, but only abnormal results are displayed) Labs Reviewed  CBC WITH DIFFERENTIAL/PLATELET - Abnormal; Notable for the following:       Result Value   WBC 17.5 (*)    Neutro Abs 14.4 (*)    Monocytes Absolute 1.2 (*)    All other components within normal limits    COMPREHENSIVE METABOLIC PANEL - Abnormal; Notable for the following:    CO2 20 (*)    Glucose, Bld 53 (*)    Creatinine, Ser 1.32 (*)    AST 78 (*)    Anion gap 16 (*)    All other components within normal limits  ETHANOL - Abnormal; Notable for the following:    Alcohol, Ethyl (B) 78 (*)    All other components within normal limits  ACETAMINOPHEN LEVEL - Abnormal; Notable for the following:    Acetaminophen (Tylenol), Serum <10 (*)    All other components within normal limits  RAPID URINE DRUG SCREEN, HOSP PERFORMED - Abnormal; Notable for the following:    Cocaine POSITIVE (*)    Tetrahydrocannabinol POSITIVE (*)    All other components within normal limits  SALICYLATE LEVEL  CBG MONITORING, ED    EKG  EKG Interpretation None       Radiology No results found.  Procedures Procedures (including critical care time)  Medications Ordered in ED Medications  ondansetron (ZOFRAN) injection 4 mg (4 mg Intravenous Given 04/23/16 1055)  ondansetron (ZOFRAN) injection 4 mg (4 mg Intravenous Given 04/23/16 0850)  sodium chloride 0.9 % bolus 1,000 mL (0 mLs Intravenous Stopped 04/23/16 1047)  dextrose 50 % solution 50 mL (50 mLs Intravenous Given 04/23/16 1051)     Initial Impression / Assessment and Plan / ED Course  I have reviewed the triage vital signs and the nursing notes.  Pertinent labs & imaging results that were available during my care of the patient were reviewed by me and considered in my medical decision making (see chart for details).     Patient is drowsy but easily aroused.Patient observed for several hours in the emergency department. He is able to tolerate oral intake. He is ambulatory without assistance. Maintaining saturations in the 90s on room air. Counseled about substance abuse. Given outpatient resources. Return precautions given.  Final Clinical Impressions(s) / ED Diagnoses   Final diagnoses:  Polysubstance abuse  Hypoglycemia    New  Prescriptions New Prescriptions   No medications on file     Loren Racer, MD 04/23/16 1318

## 2016-04-23 NOTE — ED Triage Notes (Signed)
Patient arrives to ed via [facility]  States MOM called ems states per mom patient wasn't acting normal last pm ate dinner and went to bed, found patient unresp with resp around 6 this am.  Patient was given 1mg  Narcan ednroute upon arrival Patient was alert oriented states he drank a lot of alcohol last pm and a "pint of ENJ" and admits to finding a white powder and rolled it up in  A cigarette and smoked it.

## 2018-03-04 IMAGING — DX DG KNEE COMPLETE 4+V*R*
4 series · 4 of 4 positions shown · non-contrast
Comparison: None.

CLINICAL DATA: Right knee pain and swelling without known injury.

EXAM:
RIGHT KNEE - COMPLETE 4+ VIEW

[knee ap]
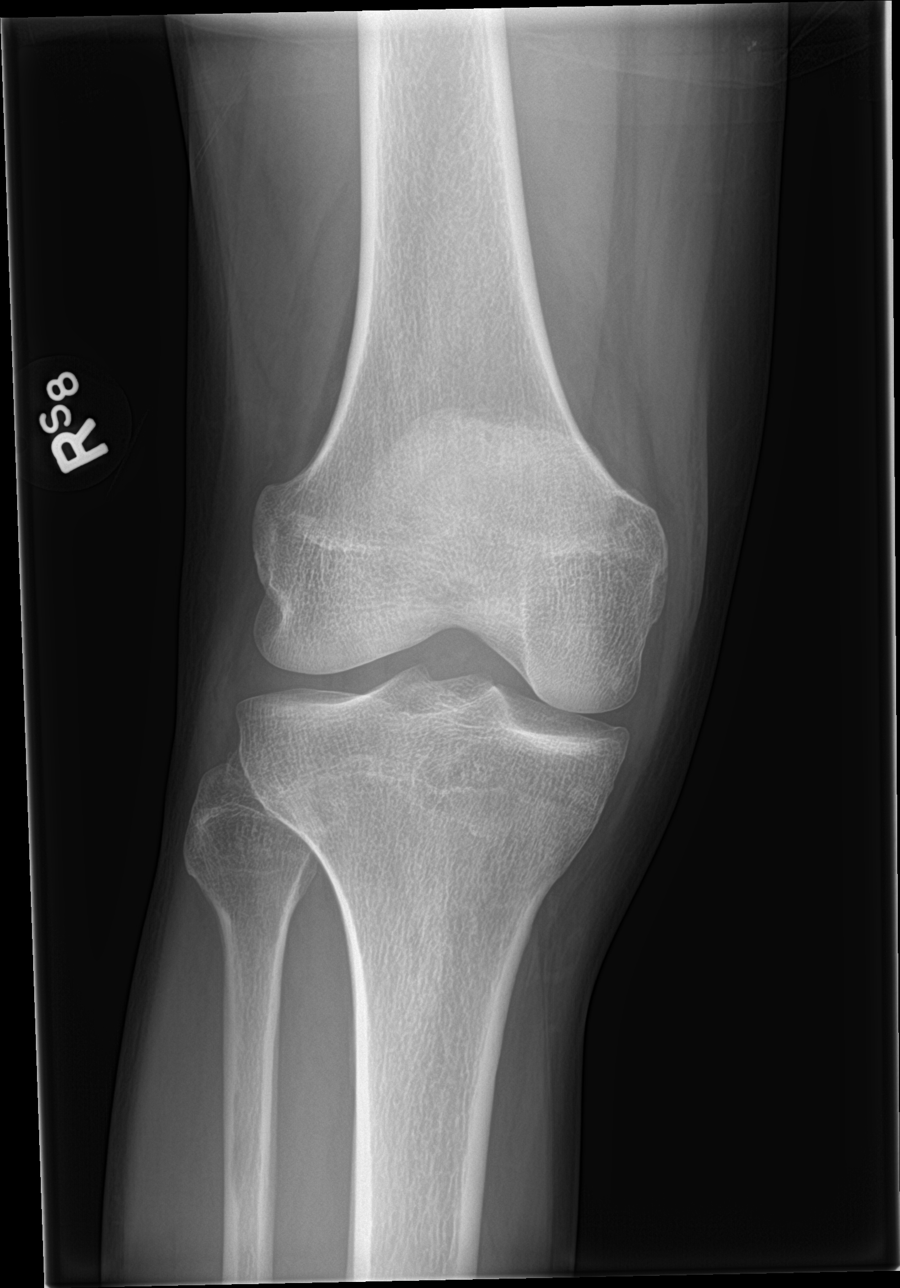

[tunnel]
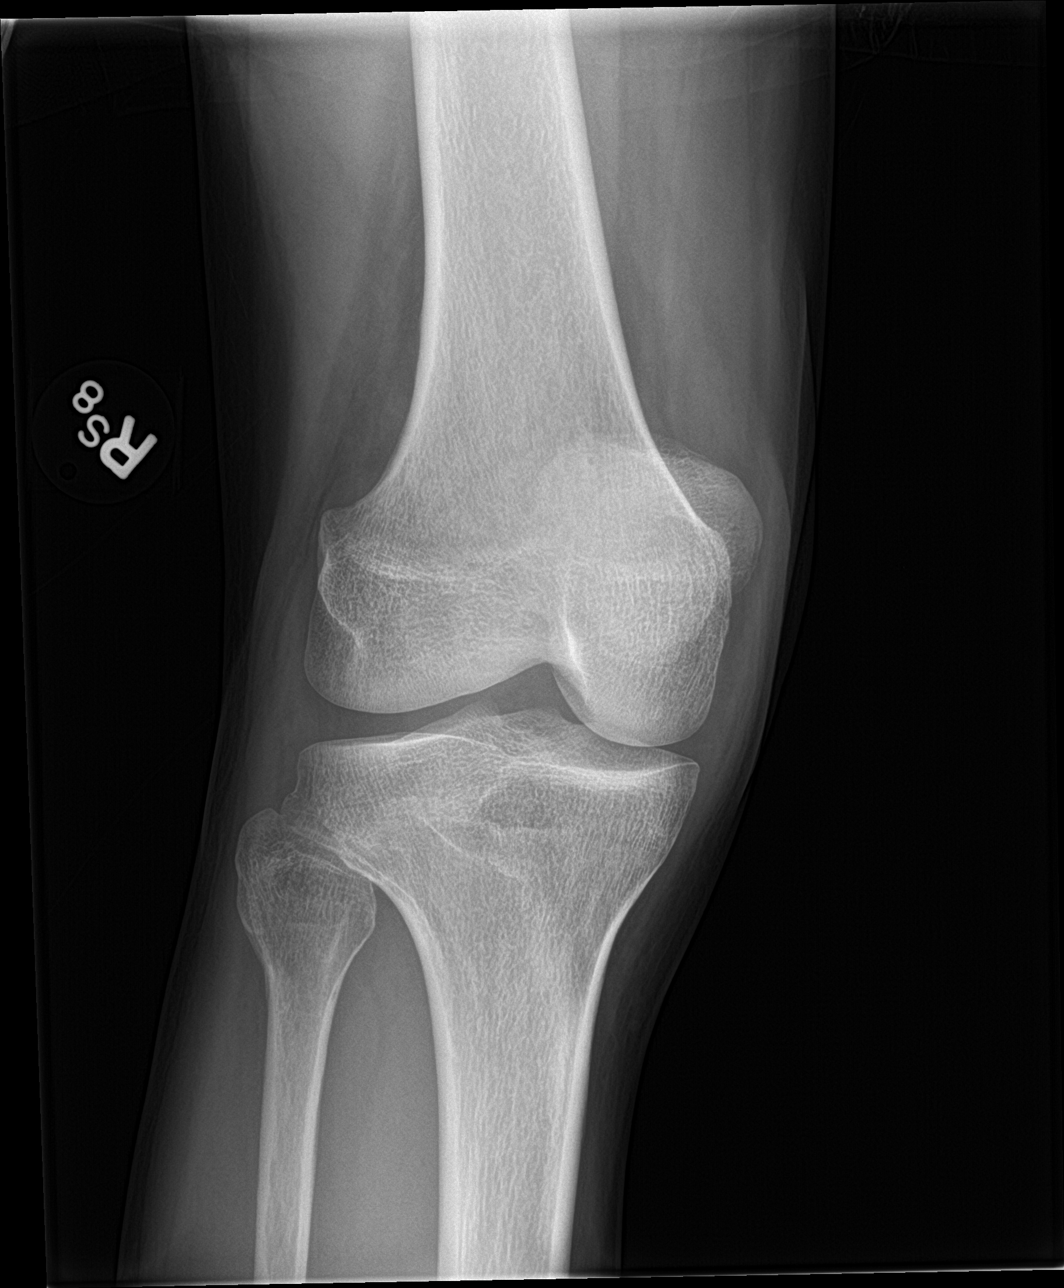

[knee lat]
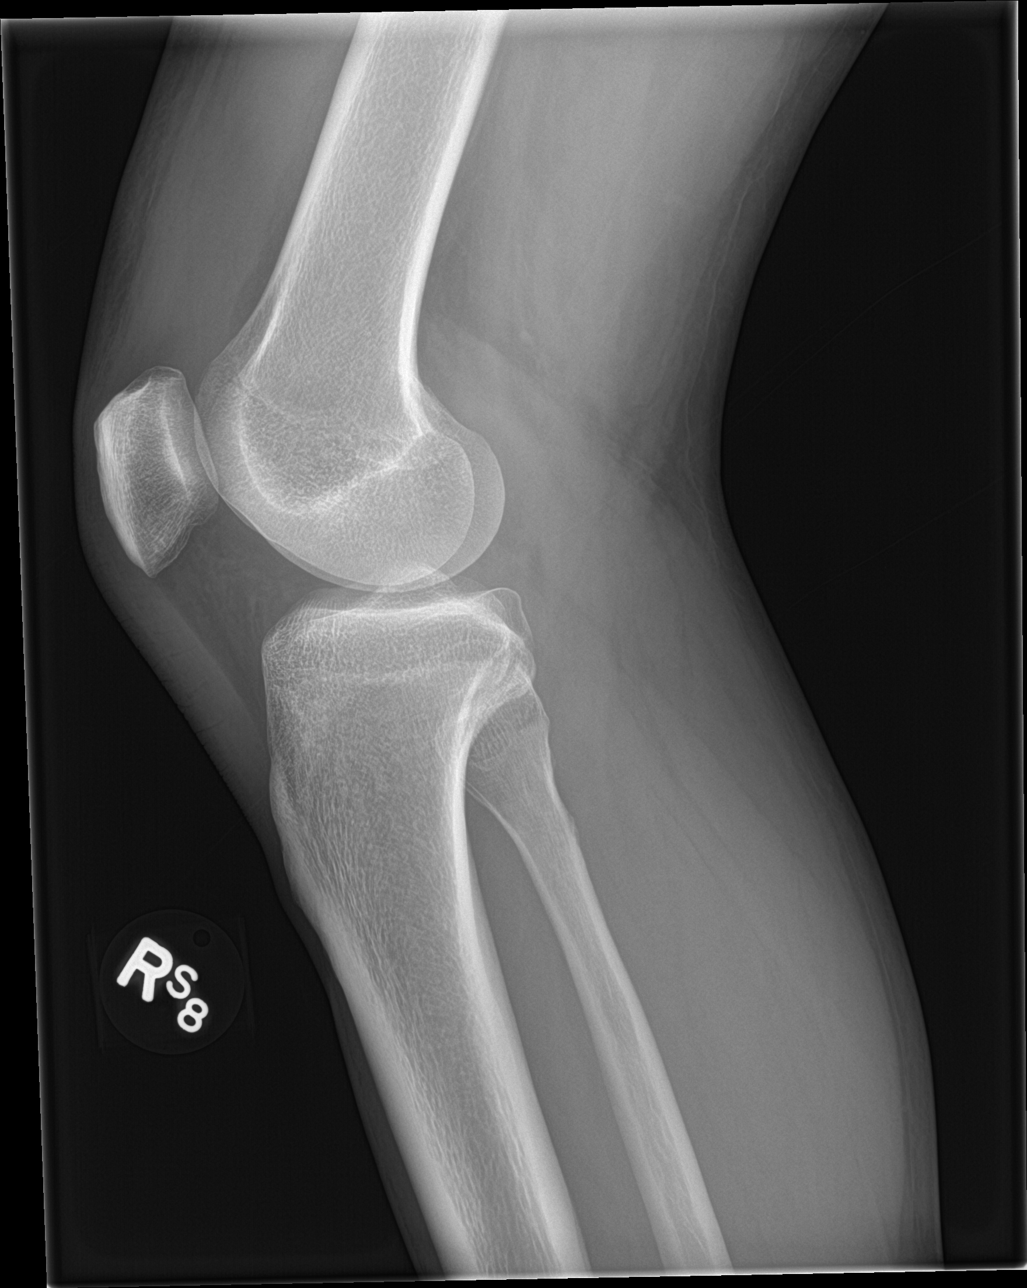

[knee obl]
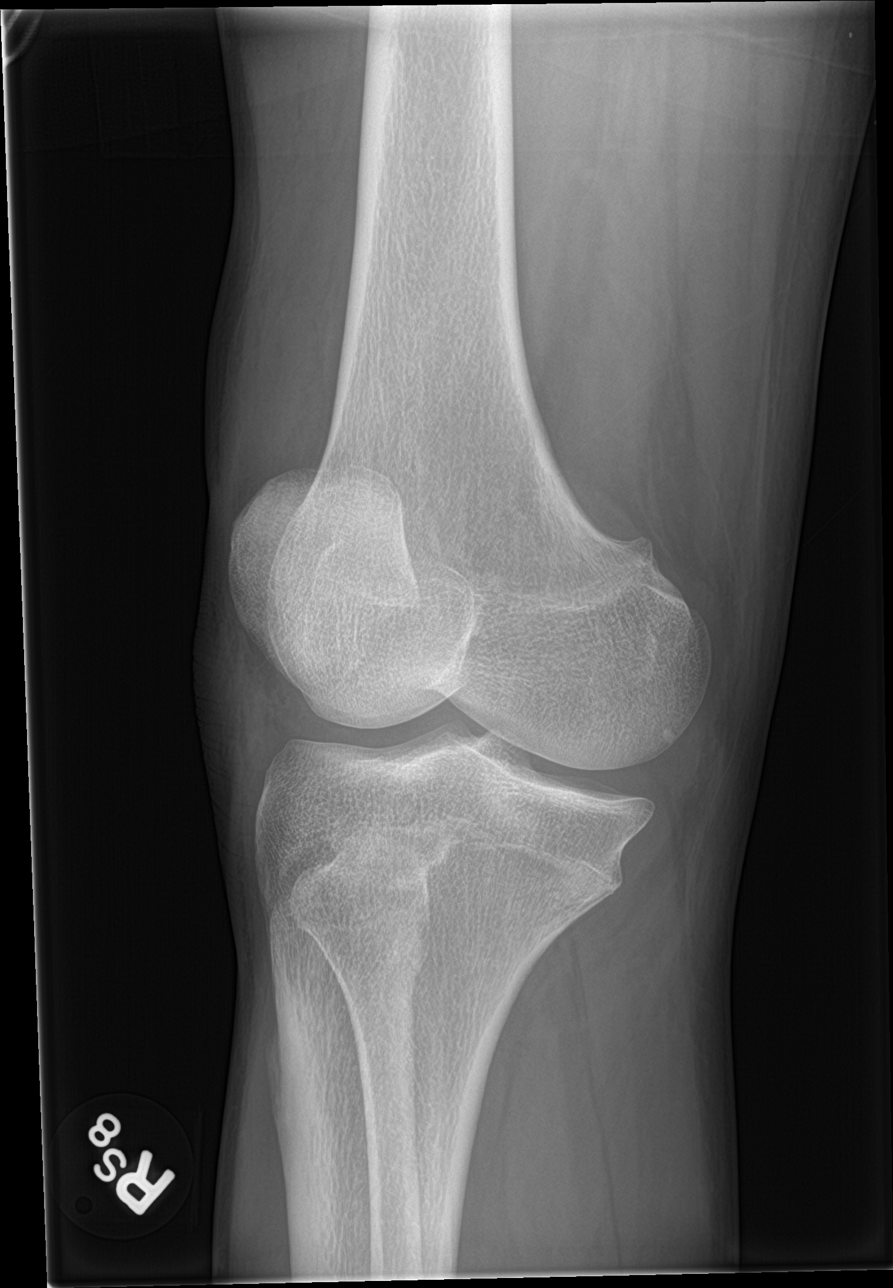

[4 of 4 positions shown; findings below may reference images not displayed]

FINDINGS: No evidence of fracture, dislocation, or joint effusion. No evidence
of arthropathy or other focal bone abnormality. Soft tissues are
unremarkable.
IMPRESSION: Normal right knee.

## 2018-06-21 ENCOUNTER — Emergency Department (HOSPITAL_COMMUNITY)
Admission: EM | Admit: 2018-06-21 | Discharge: 2018-06-21 | Disposition: A | Discharge status: Home / Self Care | Payer: 59 | Attending: Emergency Medicine | Admitting: Emergency Medicine

## 2018-06-21 ENCOUNTER — Other Ambulatory Visit: Payer: Self-pay

## 2018-06-21 ENCOUNTER — Encounter (HOSPITAL_COMMUNITY): Payer: Self-pay | Admitting: Family Medicine

## 2018-06-21 DIAGNOSIS — F17200 Nicotine dependence, unspecified, uncomplicated: Secondary | ICD-10-CM | POA: Insufficient documentation

## 2018-06-21 DIAGNOSIS — K0889 Other specified disorders of teeth and supporting structures: Secondary | ICD-10-CM | POA: Insufficient documentation

## 2018-06-21 MED ORDER — NAPROXEN 500 MG PO TABS: 500.0000 mg | ORAL_TABLET | Freq: Two times a day (BID) | ORAL | 0 refills | Status: DC

## 2018-06-21 MED ORDER — AMOXICILLIN-POT CLAVULANATE 875-125 MG PO TABS: 1.0000 | ORAL_TABLET | Freq: Two times a day (BID) | ORAL | 0 refills | Status: DC

## 2018-06-21 MED ORDER — KETOROLAC TROMETHAMINE 30 MG/ML IJ SOLN
30.0000 mg | Freq: Once | INTRAMUSCULAR | Status: AC
Start: 2018-06-21 — End: 2018-06-21
  Administered 2018-06-21: 30 mg via INTRAMUSCULAR
  Filled 2018-06-21: qty 1

## 2018-06-21 NOTE — Discharge Instructions (Addendum)
Today you were seen for dental pain.  1. Medications: Augmentin (antibiotic), naproxen for pain, tylenol, usual home medications 2. Treatment: rest, drink plenty of fluids, take medications as prescribed 3. Follow Up: Please follow up with dentistry within 48 hours. Please follow up with a primary care provider in 7 days for discussion of your diagnoses and further evaluation after today's visit; if you do not have a primary care doctor use the resource guide provided to find one; Return to the ER for high fevers, difficulty breathing, difficulty swallowing or other concerning symptoms  Apply warm compresses to jaw throughout the day. Take antibiotic and completion. Follow up with a dentist is very important for ongoing evaluation and management of recurrent dental pain. Have her return to emergency department for emergent changing or worsening symptoms.  I have prescribed you an antibiotic to treat the infection and Naproxen which is an anti-inflammatory medicine to treat the pain.  Please take all of your antibiotics until finished. You may develop abdominal discomfort or diarrhea from the antibiotic.  You may help offset this with probiotics which you can buy at the store (ask your pharmacist if unable to find) or get probiotics in the form of eating yogurt. Do not eat or take the probiotics until 2 hours after your antibiotic. If you are unable to tolerate these side effects follow-up with your primary care provider or return to the emergency department.   If you begin to experience any blistering, rashes, swelling, or difficulty breathing seek medical care for evaluation of potentially more serious side effects.   Be sure to eat something when taking the Naproxen as it can cause stomach upset and at worst stomach bleeding. Do not take additional non steroidal anti-inflammatory medicines such as Ibuprofen, Aleve, Advil, Mobic, Diclofenac, or goodie powder while taking Naproxen. You may supplement  with Tylenol. Be sure to eat something when taking the Naproxen as it can cause stomach upset and at worst stomach bleeding. Do not take additional non steroidal anti-inflammatory medicines such as Ibuprofen, Aleve, Advil, Mobic, Diclofenac, or goodie powder while taking Naproxen. You may supplement with Tylenol.   Please be aware that these medications may interact with other medications you are taking, please be sure to discuss your medication list with your pharmacist.  Call one of the dentists offices provided to schedule an appointment for re-evaluation and further management within the next 48 hours.   Please be aware that these medications may interact with other medications you are taking, please be sure to discuss your medication list with your pharmacist.  If you start to experience and new or worsening symptoms return to the emergency department. If you start to experience fever, chills, neck stiffness/pain, or inability to move your neck or open your mouth come back to the emergency department immediately.   You have a dental problem. Use the resource guide listed below to help you find a dentist if you do not already have one to follow up with. It is very important that you get evaluated by a dentist as soon as possible. Call tomorrow to schedule an appointment. Read the instructions below.  Eat a soft or liquid diet and rinse your mouth out after meals with warm water. You should see a dentist or return here at once if you have increased swelling, increased pain or uncontrolled bleeding from the site of your injury.  SEEK MEDICAL CARE IF:  You have increased pain not controlled with medicines.  You have swelling around your tooth,  in your face or neck.  You have bleeding which starts, continues, or gets worse.  You have a fever >101 If you are unable to open your mouth  Dental Care: Organization         Address  Phone  Notes  [facility] [address], [place] [phone] Accepts children up to age 47 who are enrolled in [place] or [place] Health Choice; pregnant women with a Medicaid card; and children who have applied for Medicaid or [place] Health Choice, but were declined, whose parents can pay a reduced fee at time of service.  [facility] [place]  [address], [place] [phone] Accepts children up to age 921 who are enrolled in [place] or [place] Health Choice; pregnant women with a Medicaid card; and children who have applied for Medicaid or [place] Health Choice, but were declined, whose parents can pay a reduced fee at time of service.  [facility]  [address], [place] [phone] Patients are seen by appointment only. Walk-ins are not accepted. [facility] will see patients 47 years of age and older. Monday - Tuesday (8am-5pm) Most Wednesdays (8:30-5pm) $30 per visit, cash only  [facility]  [address], [facility] [phone] Patients are seen by appointment only. Walk-ins are not accepted. [facility] will see patients 47 years of age and older. One Wednesday Evening (Monthly: Volunteer Based).  $30 per visit, cash only  [employer] [facility]  [phone] for adults; Children under age 634, call Graduate Pediatric Dentistry at [phone]. Children aged 624-14, please call [phone] to request a pediatric application.  Dental services are provided in all areas of dental care including fillings, crowns and bridges, complete and partial dentures, implants, gum treatment, root canals, and extractions. Preventive care is also provided. Treatment is provided to both adults and children. Patients are selected via a lottery and there is often a waiting list.   [facility] [address], [place]  [phone] [email or web address]   [facility]  [address], [place], [place] [phone], Ext. [phone] Second and Fourth Thursday of each month, opens at 6:30 AM; Clinic ends at 9 AM.  Patients are seen on a first-come first-served basis, and a limited number are seen during each clinic.   [facility]  [address], [place], [place] [phone]   Eligibility Requirements You must have lived in [place], [place], or [place] counties for at least the last three months.   You cannot be eligible for state or federal sponsored [place] Cityhealthcare insurance, including [employer]eterans [employer], [place]icaid, or [employer]icare.   You generally cannot be eligible for healthcare insurance through your employer.    How to apply: Eligibility screenings are held every Tuesday and Wednesday afternoon from 1:00 pm until 4:00 pm. You do not need an appointment for the interview!  [facility] [address], [place], [place] [phone]   [facility]  [phone]   [facility]  [phone]   [facility]  [phone]    RESOURCE GUIDE  Dental Problems  Patients with Medicaid: [facility]                     [facility] [phone]  [address] Phone:  [phone]                                                  Phone:  [phone]  If unable to pay or uninsured, contact:  [facility] or [facility]. to become qualified for the adult dental clinic.  Chronic Pain Problems Contact [facility]  [phone] Patients need to be referred by their primary care doctor.  Insufficient Money for Medicine Contact United Way:  call "211" or [facility] 639-300-0695.  No Primary Care Doctor Call [facility]  [phone] Other agencies that provide inexpensive medical care    [facility]  [phone]    [facility]  [phone]    [facility]  [phone]    [facility]  [phone]    Planned Parenthood  [phone]    [facility]  [phone]  [facility]  [phone] [facility]  [phone] [facility]   [phone] (emergency services [phone])  Substance Abuse Resources [facility]  [phone] [facility] [phone] [facility] [phone] Floydene Flock [phone] Residential & Outpatient Substance Abuse Program  [phone]  Abuse/Neglect [facility] Child Abuse Hotline 308-478-6745 [facility] Child Abuse Hotline 713-312-8014 (After Hours)  Emergency Shelter [facility] [phone]  Maternity Homes Room at the [place] [facility] [place] [phone] [facility] Services [phone]  MRSA Hotline #:   321-849-8448  [facility] Resources  [facility] [place]     United Way                          [facility] Dept. [address] [place]                       [address]      [address] [place] [address]  Blondell Reveal Phone:  [phone]                                   Phone:  [phone]                 Phone:  [phone]  [facility] Phone:  [phone]  [facility] Child Abuse Hotline 475-751-5139 236-026-4703 (After Hours)

## 2018-06-21 NOTE — ED Provider Notes (Signed)
[facility]-EMERGENCY DEPT Provider Note   CSN: [id number] Arrival date & time: 06/21/18  1326    History   Chief Complaint Chief Complaint  Patient presents with  . Dental Pain    HPI IMHOTEP SCORE is a 48 y.o. male with no significant past medical history presenting with constant non radiating right upper dental pain onset 2 weeks ago. Patient reports pain worsened last night and he noticed mild right sided facial edema. Patient states he has been taking ibuprofen with partial relief. Patient states he has had multiple cavities and broken teeth in the past. Patient denies having a dentist currently. Patient denies fever, nausea, vomiting, shortness of breath, or abdominal pain. Patient denies congestion, rhinorrhea, ear pain, sore throat, trouble swallowing, or drooling. Patient denies cough, shortness of breath, sick contacts, or recent travel.       HPI  History reviewed. No pertinent past medical history.  There are no active problems to display for this patient.   History reviewed. No pertinent surgical history.      Home Medications    Prior to Admission medications   Medication Sig Start Date End Date Taking? Authorizing Provider  amoxicillin-clavulanate (AUGMENTIN) 875-125 MG tablet Take 1 tablet by mouth every 12 (twelve) hours. 06/21/18   Carlyle Basques P, PA-C  naproxen (NAPROSYN) 500 MG tablet Take 1 tablet (500 mg total) by mouth 2 (two) times daily. 06/21/18   Leretha Dykes, PA-C    Family History History reviewed. No pertinent family history.  Social History Social History   Tobacco Use  . Smoking status: Current Every Day Smoker    Packs/day: 0.50  . Smokeless tobacco: Never Used  Substance Use Topics  . Alcohol use: Yes    Alcohol/week: 2.0 standard drinks    Types: 2 Cans of beer per week    Comment: Daily. Last drink: Yesterday   . Drug use: Not Currently    Types: Cocaine     Allergies   Patient has no known  allergies.   Review of Systems Review of Systems  Constitutional: Negative for chills, diaphoresis and fever.  HENT: Positive for dental problem and facial swelling. Negative for congestion, drooling, rhinorrhea, sinus pain, sore throat, trouble swallowing and voice change.   Respiratory: Negative for cough and shortness of breath.   Cardiovascular: Negative for chest pain.  Gastrointestinal: Negative for abdominal pain, nausea and vomiting.  Endocrine: Negative for cold intolerance and heat intolerance.  Genitourinary: Negative for dysuria.  Musculoskeletal: Negative for neck pain and neck stiffness.  Skin: Negative for rash.  Allergic/Immunologic: Negative for immunocompromised state.  Hematological: Negative for adenopathy.    Physical Exam Updated Vital Signs BP (!) 143/96   Pulse 64   Temp 98.5 F (36.9 C) (Oral)   Resp 15   Ht 6' (1.829 m)   Wt 68 kg   SpO2 100%   BMI 20.34 kg/m   Physical Exam Physical Exam  Constitutional: Pt appears well-developed and well-nourished.  HENT:  Head: Normocephalic.  Right Ear: Tympanic membrane, external ear and ear canal normal.  Left Ear: Tympanic membrane, external ear and ear canal normal.  Nose: Nose normal. Right sinus exhibits no maxillary sinus tenderness and no frontal sinus tenderness. Left sinus exhibits no maxillary sinus tenderness and no frontal sinus tenderness.  Mouth/Throat: Uvula is midline, oropharynx is clear and moist and mucous membranes are normal. No oral lesions. Abnormal dentition. Dental Caries present. No uvula swelling or lacerations. No oropharyngeal exudate, posterior oropharyngeal  edema, posterior oropharyngeal erythema or tonsillar abscesses.  Mild gingival swelling. Small right sided area of fluctuance noted over the right upper canine tooth consistent with a small gross abscess. Mild right sided facial edema. No signs of Ludwig's angina noted.  Eyes: Conjunctivae are normal. Pupils are equal, round,  and reactive to light. Right eye exhibits no discharge. Left eye exhibits no discharge.  Neck: Normal range of motion. Neck supple. No stridor. Handling secretions without difficulty. No nuchal rigidity. No cervical lymphadenopathy Cardiovascular: Normal rate, regular rhythm and normal heart sounds.   Pulmonary/Chest: Effort normal. No respiratory distress. Equal chest rise. Abdominal: Abdomen is soft and non tender. Pt exhibits no distension.  Lymphadenopathy: Pt has no cervical adenopathy.  Neurological: Pt is alert.  Skin: Skin is warm and dry.  Psychiatric: Pt has a normal mood and affect.  Nursing note and vitals reviewed.   ED Treatments / Results  Labs (all labs ordered are listed, but only abnormal results are displayed) Labs Reviewed - No data to display  EKG None  Radiology No results found.  Procedures Procedures (including critical care time)  Medications Ordered in ED Medications  ketorolac (TORADOL) 30 MG/ML injection 30 mg (has no administration in time range)     Initial Impression / Assessment and Plan / ED Course  I have reviewed the triage vital signs and the nursing notes.  Pertinent labs & imaging results that were available during my care of the patient were reviewed by me and considered in my medical decision making (see chart for details).       Patient with toothache. Small gross abscess noted. Abscess is small and does not require incision and drainage at this time.  Exam unconcerning for Ludwig's angina or spread of infection.  Will treat with Augmentin and anti-inflammatories medicine.  Discussed return precautions with patient. Urged patient to follow-up with dentist.    Final Clinical Impressions(s) / ED Diagnoses   Final diagnoses:  Pain, dental    ED Discharge Orders         Ordered    amoxicillin-clavulanate (AUGMENTIN) 875-125 MG tablet  Every 12 hours     06/21/18 1418    naproxen (NAPROSYN) 500 MG tablet  2 times daily      06/21/18 [address]1418           Rhonda Vangieson [place], [place]-C 06/21/18 1420    Margarita Grizzleay, Danielle, MD 06/25/18 1426

## 2018-06-21 NOTE — ED Triage Notes (Signed)
Patient states he has a tooth abscessed on the right upper. Symptoms started two weeks ago.

## 2019-05-17 ENCOUNTER — Encounter (HOSPITAL_COMMUNITY): Payer: Self-pay | Admitting: Emergency Medicine

## 2019-05-17 ENCOUNTER — Emergency Department (HOSPITAL_COMMUNITY)
Admission: EM | Admit: 2019-05-17 | Discharge: 2019-05-17 | Disposition: A | Discharge status: Home / Self Care | Payer: Self-pay | Attending: Emergency Medicine | Admitting: Emergency Medicine

## 2019-05-17 ENCOUNTER — Other Ambulatory Visit: Payer: Self-pay

## 2019-05-17 DIAGNOSIS — F17210 Nicotine dependence, cigarettes, uncomplicated: Secondary | ICD-10-CM | POA: Insufficient documentation

## 2019-05-17 DIAGNOSIS — K0889 Other specified disorders of teeth and supporting structures: Secondary | ICD-10-CM | POA: Insufficient documentation

## 2019-05-17 DIAGNOSIS — Z79899 Other long term (current) drug therapy: Secondary | ICD-10-CM | POA: Insufficient documentation

## 2019-05-17 MED ORDER — KETOROLAC TROMETHAMINE 30 MG/ML IJ SOLN
30.0000 mg | Freq: Once | INTRAMUSCULAR | Status: AC
  Administered 2019-05-17: 30 mg via INTRAMUSCULAR
  Filled 2019-05-17: qty 1

## 2019-05-17 MED ORDER — AMOXICILLIN 500 MG PO CAPS: 500.0000 mg | ORAL_CAPSULE | Freq: Three times a day (TID) | ORAL | 0 refills | Status: DC

## 2019-05-17 NOTE — ED Triage Notes (Signed)
Pt in w/R mouth pain, states he has a R upper molar dental abcess

## 2019-05-17 NOTE — ED Notes (Signed)
Pt was discharged from the ED. Pt read and understood discharge paperwork. Pt had vital signs completed. Pt conscious, breathing, and A&Ox4. No distress noted. Pt speaking in complete sentences. Pt ambulated out of the ED with a smooth and steady gait. E-signature not available.  

## 2019-05-17 NOTE — ED Notes (Signed)
Pt came to the ED per triage complaint. Pt conscious, breathing, and A&Ox4. Pt brought back to [address] via wheelchair. Pt endorses "I have an abscess on the right upper side of my mouth which I poked with a hot needle three times and drained it". Chest rise and fall equally with non-labored breathing. Lungs clear apex to base. Abd soft and non-tender. Pt denies chest pain, n/v/d, shortness of breath, and f/c. Bed in lowest position with call light within reach. Pt on continuous blood pressure, pulse ox, and cardiac monitor. Will continue to monitor. Awaiting MD eval. No distress noted.

## 2019-05-17 NOTE — Discharge Instructions (Addendum)
You have been seen in the Emergency Department (ED) today for dental pain.  Please take your prescribed antibiotic.  You may also take over-the-counter pain medication such as ibuprofen or Tylenol according to the label instructions unless a doctor has previously told you to avoid this type of medication (due to stomach ulcers, kidney disease or liver disease, for example).   Please follow-up with one of the dental clinics provided to you below or in your paperwork. Call and tell them you were seen in the Emergency Dept and arrange for an appointment. You may have to call multiple places in order to find a place to be seen.  Dental Assistance If the dentist on-call cannot see you, please use the resources below:   Patients with Medicaid: [employer] [phone] [address], [phone] [address] [phone]  If unable to pay, or uninsured, contact [facility] [phone]) or [facility] Department [phone] in [place], [phone] in [facility]) to become qualified for the adult dental clinic  Other Low-Cost Community Dental Services: [facility]- [address] [place], [place], [address]    [phone], Ext. [phone]    2nd and 4th Thursday of the month at 6:30am    10 clients each day by appointment, can sometimes see walk-in     patients if someone does not show for an appointment [facility]- [address] [place], [place], [address]    [phone] [facility] [address] [place], [place], [address]    [phone]  [facility] Department- [phone] [facility] Health Department- [phone] [facility] [phone]   Please see you dentist as soon as possible; only a dentist will be able to fix your problem(s).    Return to the ED if you develop worsening pain, fever, pus/drainage, difficulty breathing, or other symptoms that concern you.

## 2019-05-17 NOTE — ED Provider Notes (Signed)
[facility] EMERGENCY DEPARTMENT Provider Note   CSN: [id number] Arrival date & time: 05/17/19  0541     History Chief Complaint  Patient presents with  . Dental Pain    B[facility] Hold[facility] is a 49 y.o. male with PMH significant for polysubstance abuse and poor dentition who presents to the ED with complaints of dental pain.  Patient is complaining of pain and an abscess at base of #4 tooth on right upper side.  He states that he has been taking over-the-counter analgesics without much success.  He reports heating up a sewing needle and performing his own incision and drainage procedure which yielded white purulent material.  He presents to the ED requesting the shot that he received last time he was evaluated for dental pain.  He states that he has been unable to schedule an appointment with a dentist due to financial constraints.  He denies any recent illicit drug use.  He denies any fevers or chills, neck or face swelling, voice change, wheezing, difficulty swallowing, difficulty breathing, tongue swelling, inability to open the mouth, or other symptoms.  HPI     History reviewed. No pertinent past medical history.  There are no problems to display for this patient.   History reviewed. No pertinent surgical history.     No family history on file.  Social History   Tobacco Use  . Smoking status: Current Every Day Smoker    Packs/day: 0.50  . Smokeless tobacco: Never Used  Substance Use Topics  . Alcohol use: Yes    Alcohol/week: 2.0 standard drinks    Types: 2 Cans of beer per week    Comment: Daily. Last drink: Yesterday   . Drug use: Not Currently    Types: Cocaine    Home Medications Prior to Admission medications   Medication Sig Start Date End Date Taking? Authorizing Provider  amoxicillin (AMOXIL) 500 MG capsule Take 1 capsule (500 mg total) by mouth 3 (three) times daily. 05/17/19   Corena Herter, PA-C  amoxicillin-clavulanate (AUGMENTIN)  875-125 MG tablet Take 1 tablet by mouth every 12 (twelve) hours. 06/21/18   Darlin Drop P, PA-C  naproxen (NAPROSYN) 500 MG tablet Take 1 tablet (500 mg total) by mouth 2 (two) times daily. 06/21/18   Arville Lime, PA-C    Allergies    Patient has no known allergies.  Review of Systems   Review of Systems  Constitutional: Negative for fever.  HENT: Positive for dental problem. Negative for drooling, facial swelling, trouble swallowing and voice change.   Musculoskeletal: Negative for neck pain and neck stiffness.    Physical Exam Updated Vital Signs BP (!) 159/89   Pulse 79   Temp 98.4 F (36.9 C) (Oral)   Resp 15   Wt 68 kg   SpO2 95%   BMI 20.33 kg/m   Physical Exam Vitals and nursing note reviewed. Exam conducted with a chaperone present.  Constitutional:      General: He is not in acute distress.    Appearance: Normal appearance. He is not ill-appearing.  HENT:     Head: Normocephalic and atraumatic.     Mouth/Throat:     Comments: Patent oropharynx.  No uvular deviation or tonsillar hypertrophy.  No erythema or exudates noted.  Poor dentition throughout.  Erythema at base of #4 and #5 teeth.  No masses appreciated.  No areas of fluctuance concerning for abscess.  No tongue swelling.  No trismus.  No floor of mouth  induration.  Tolerating secretions well.  Halitosis noted. Eyes:     General: No scleral icterus.    Conjunctiva/sclera: Conjunctivae normal.  Cardiovascular:     Rate and Rhythm: Normal rate and regular rhythm.     Pulses: Normal pulses.     Heart sounds: Normal heart sounds.  Pulmonary:     Effort: Pulmonary effort is normal. No respiratory distress.     Breath sounds: Normal breath sounds. No wheezing.  Musculoskeletal:     Cervical back: Normal range of motion and neck supple. No rigidity or tenderness.  Skin:    General: Skin is dry.  Neurological:     Mental Status: He is alert.     GCS: GCS eye subscore is 4. GCS verbal subscore is 5. GCS  motor subscore is 6.  Psychiatric:        Mood and Affect: Mood normal.        Behavior: Behavior normal.        Thought Content: Thought content normal.     ED Results / Procedures / Treatments   Labs (all labs ordered are listed, but only abnormal results are displayed) Labs Reviewed - No data to display  EKG None  Radiology No results found.  Procedures Procedures (including critical care time)  Medications Ordered in ED Medications  ketorolac (TORADOL) 30 MG/ML injection 30 mg (30 mg Intramuscular Given 05/17/19 8315)    ED Course  I have reviewed the triage vital signs and the nursing notes.  Pertinent labs & imaging results that were available during my care of the patient were reviewed by me and considered in my medical decision making (see chart for details).    MDM Rules/Calculators/A&P                      Patient is presented to the ED for a tooth ache.  Physical exam revealed poor dentition throughout as well as halitosis, however no obvious abscess.  Patient probably endorses that he successfully drained it independently.  No trismus, difficulty tolerating secretions, voice change, tongue swelling, wheezing, facial swelling, or other concerning physical exam findings.  Patient is simply requesting analgesia as well as a course of antibiotics, which I feel is appropriate given his history and erythema noted at base of affected tooth.  Treating discomfort with Toradol 30 mg IM here in the ED.  Advised patient to continue over-the-counter analgesics, as directed.  Amoxicillin as ABX.  We will provide patient with numerous resources for affordable dental procedures locally.  We will also encourage him to establish with a primary care provider at [facility] health community health and wellness.  Strict ED return precautions discussed with the patient.  Patient voiced understanding and is agreeable to the plan.   Final Clinical Impression(s) / ED Diagnoses Final diagnoses:    Pain, dental    Rx / DC Orders ED Discharge Orders         Ordered    amoxicillin (AMOXIL) 500 MG capsule  3 times daily     05/17/19 0703           Lorelee New, PA-C 05/17/19 1761    Raeford Razor, MD 05/17/19 [phone]

## 2021-08-26 ENCOUNTER — Ambulatory Visit: Payer: Self-pay

## 2021-08-27 ENCOUNTER — Other Ambulatory Visit: Payer: Self-pay

## 2021-08-27 ENCOUNTER — Ambulatory Visit
Admission: EM | Admit: 2021-08-27 | Discharge: 2021-08-27 | Disposition: A | Discharge status: Home / Self Care | Payer: Commercial Managed Care - HMO | Attending: Internal Medicine | Admitting: Internal Medicine

## 2021-08-27 ENCOUNTER — Encounter: Payer: Self-pay | Admitting: Emergency Medicine

## 2021-08-27 DIAGNOSIS — Z113 Encounter for screening for infections with a predominantly sexual mode of transmission: Secondary | ICD-10-CM | POA: Insufficient documentation

## 2021-08-27 DIAGNOSIS — Z202 Contact with and (suspected) exposure to infections with a predominantly sexual mode of transmission: Secondary | ICD-10-CM | POA: Insufficient documentation

## 2021-08-27 MED ORDER — CEFTRIAXONE SODIUM 500 MG IJ SOLR
500.0000 mg | Freq: Once | INTRAMUSCULAR | Status: AC
  Administered 2021-08-27: 500 mg via INTRAMUSCULAR

## 2021-08-27 NOTE — ED Provider Notes (Signed)
[facility]    CSN: [id number] Arrival date & time: 08/27/21  1240      History   Chief Complaint Chief Complaint  Patient presents with   Exposure to STD    HPI Brett [facility]en is a 51 y.o. male.   Patient presents today for STD testing.  He reports that he has been exposed to gonorrhea from a recent sexual contact.  Patient is reporting penile discharge and lower back pain that has been present for approximately 1 week.  He is not sure the color of the penile discharge.  Denies any testicular pain.  Patient last had sexual intercourse with the sexual partner about 3 days ago.   Exposure to STD    History reviewed. No pertinent past medical history.  There are no problems to display for this patient.   History reviewed. No pertinent surgical history.     Home Medications    Prior to Admission medications   Medication Sig Start Date End Date Taking? Authorizing Provider  amoxicillin (AMOXIL) 500 MG capsule Take 1 capsule (500 mg total) by mouth 3 (three) times daily. Patient not taking: Reported on 08/27/2021 05/17/19   Lorelee New, PA-C  amoxicillin-clavulanate (AUGMENTIN) 875-125 MG tablet Take 1 tablet by mouth every 12 (twelve) hours. Patient not taking: Reported on 08/27/2021 06/21/18   Carlyle Basques P, PA-C  naproxen (NAPROSYN) 500 MG tablet Take 1 tablet (500 mg total) by mouth 2 (two) times daily. 06/21/18   Leretha Dykes, PA-C    Family History History reviewed. No pertinent family history.  Social History Social History   Tobacco Use   Smoking status: Every Day    Packs/day: 0.50    Types: Cigarettes   Smokeless tobacco: Never  Vaping Use   Vaping Use: Never used  Substance Use Topics   Alcohol use: Yes    Alcohol/week: 2.0 standard drinks of alcohol    Types: 2 Cans of beer per week    Comment: Daily. Last drink: Yesterday    Drug use: Not Currently    Types: Cocaine     Allergies   Patient has no known  allergies.   Review of Systems Review of Systems Per HPI  Physical Exam Triage Vital Signs ED Triage Vitals  Enc Vitals Group     BP 08/27/21 1311 126/83     Pulse Rate 08/27/21 1311 60     Resp 08/27/21 1311 18     Temp 08/27/21 1311 98.4 F (36.9 C)     Temp Source 08/27/21 1311 Oral     SpO2 08/27/21 1311 97 %     Weight --      Height --      Head Circumference --      Peak Flow --      Pain Score 08/27/21 1312 2     Pain Loc --      Pain Edu? --      Excl. in GC? --    No data found.  Updated Vital Signs BP 126/83 (BP Location: Left Arm)   Pulse 60   Temp 98.4 F (36.9 C) (Oral)   Resp 18   SpO2 97%   Visual Acuity Right Eye Distance:   Left Eye Distance:   Bilateral Distance:    Right Eye Near:   Left Eye Near:    Bilateral Near:     Physical Exam Constitutional:      General: He is not in acute distress.  Appearance: Normal appearance. He is not toxic-appearing or diaphoretic.  HENT:     Head: Normocephalic and atraumatic.  Eyes:     Extraocular Movements: Extraocular movements intact.     Conjunctiva/sclera: Conjunctivae normal.  Pulmonary:     Effort: Pulmonary effort is normal.  Genitourinary:    Comments: Deferred with shared decision-making.  Self swab performed. Neurological:     General: No focal deficit present.     Mental Status: He is alert and oriented to person, place, and time. Mental status is at baseline.  Psychiatric:        Mood and Affect: Mood normal.        Behavior: Behavior normal.        Thought Content: Thought content normal.        Judgment: Judgment normal.      UC Treatments / Results  Labs (all labs ordered are listed, but only abnormal results are displayed) Labs Reviewed  CYTOLOGY, (ORAL, ANAL, URETHRAL) ANCILLARY ONLY    EKG   Radiology No results found.  Procedures Procedures (including critical care time)  Medications Ordered in UC Medications  cefTRIAXone (ROCEPHIN) injection 500 mg  (500 mg Intramuscular Given 08/27/21 1334)    Initial Impression / Assessment and Plan / UC Course  I have reviewed the triage vital signs and the nursing notes.  Pertinent labs & imaging results that were available during my care of the patient were reviewed by me and considered in my medical decision making (see chart for details).     Will prophylactically treat for gonorrhea exposure with IM Rocephin today in urgent care.  Patient advised to refrain from sexual activity at least 7 days following treatment.  Cytology swab pending.  Discussed safe sex practices with patient.  Discussed return precautions.  Patient verbalized understanding and was agreeable with plan. Final Clinical Impressions(s) / UC Diagnoses   Final diagnoses:  STD exposure  Screening examination for venereal disease     Discharge Instructions      Your are being treated for gonorrhea.  Your penile swab is pending.  Please follow-up if symptoms persist or worsen.  Refrain from sexual activity for at least 7 days following treatment.    ED Prescriptions   None    PDMP not reviewed this encounter.   Teodora Medici, [place] 08/27/21 1341

## 2021-08-27 NOTE — ED Triage Notes (Signed)
Pt here for STD sx and known exposure to [facility]

## 2021-08-27 NOTE — Discharge Instructions (Signed)
Your are being treated for gonorrhea.  Your penile swab is pending.  Please follow-up if symptoms persist or worsen.  Refrain from sexual activity for at least 7 days following treatment.

## 2021-08-28 LAB — CYTOLOGY, (ORAL, ANAL, URETHRAL) ANCILLARY ONLY
Chlamydia: NEGATIVE
Comment: NEGATIVE
Comment: NEGATIVE
Comment: NORMAL
Neisseria Gonorrhea: NEGATIVE
Trichomonas: NEGATIVE
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
## 2021-09-13 ENCOUNTER — Encounter: Payer: Self-pay | Admitting: Family

## 2021-09-13 ENCOUNTER — Ambulatory Visit (INDEPENDENT_AMBULATORY_CARE_PROVIDER_SITE_OTHER): Payer: Commercial Managed Care - HMO | Admitting: Family

## 2021-09-13 VITALS — BP 112/77 | HR 83 | Temp 98.3°F | Resp 18 | Ht 71.46 in | Wt 169.0 lb

## 2021-09-13 DIAGNOSIS — F32A Depression, unspecified: Secondary | ICD-10-CM

## 2021-09-13 DIAGNOSIS — B353 Tinea pedis: Secondary | ICD-10-CM | POA: Diagnosis not present

## 2021-09-13 DIAGNOSIS — Z7689 Persons encountering health services in other specified circumstances: Secondary | ICD-10-CM

## 2021-09-13 DIAGNOSIS — F419 Anxiety disorder, unspecified: Secondary | ICD-10-CM | POA: Diagnosis not present

## 2021-09-13 DIAGNOSIS — R2230 Localized swelling, mass and lump, unspecified upper limb: Secondary | ICD-10-CM

## 2021-09-13 NOTE — Progress Notes (Signed)
Pt presents to establish care states that he was told he has high cholesterol but not taking any medications, has not seen provider in approx 2-3 years   Phq-9 =24

## 2021-09-13 NOTE — Patient Instructions (Signed)
Thank you for choosing Primary Care at [facility] for your medical home!    Brett Holden was seen by Rema Fendt, NP today.   Alcario Drought Olkowski's primary care provider is Rema Fendt, NP.   For the best care possible,  you should try to see [name], NP whenever you come to office.   We look forward to seeing you again soon!  If you have any questions about your visit today,  please call us at [phone]  Or feel free to reach your provider via MyChart.    Keeping you healthy   Get these tests Blood pressure- Have your blood pressure checked once a year by your healthcare provider.  Normal blood pressure is 120/80. Weight- Have your body mass index (BMI) calculated to screen for obesity.  BMI is a measure of body fat based on height and weight. You can also calculate your own BMI at https://www.west-esparza.com/. Cholesterol- Have your cholesterol checked regularly starting at age 47, sooner may be necessary if you have diabetes, high blood pressure, if a family member developed heart diseases at an early age or if you smoke.  Chlamydia, HIV, and other sexual transmitted disease- Get screened each year until the age of 51 then within three months of each new sexual partner. Diabetes- Have your blood sugar checked regularly if you have high blood pressure, high cholesterol, a family history of diabetes or if you are overweight.   Get these vaccines Flu shot- Every fall. Tetanus shot- Every 10 years. Menactra- Single dose; prevents meningitis.   Take these steps Don't smoke- If you do smoke, ask your healthcare provider about quitting. For tips on how to quit, go to www.smokefree.gov or call 1-800-QUIT-NOW. Be physically active- Exercise 5 days a week for at least 30 minutes.  If you are not already physically active start slow and gradually work up to 30 minutes of moderate physical activity.  Examples of moderate activity include walking briskly, mowing the  yard, dancing, swimming bicycling, etc. Eat a healthy diet- Eat a variety of healthy foods such as fruits, vegetables, low fat milk, low fat cheese, yogurt, lean meats, poultry, fish, beans, tofu, etc.  For more information on healthy eating, go to www.thenutritionsource.org Drink alcohol in moderation- Limit alcohol intake two drinks or less a day.  Never drink and drive. Dentist- Brush and floss teeth twice daily; visit your dentis twice a year. Depression-Your emotional health is as important as your physical health.  If you're feeling down, losing interest in things you normally enjoy please talk with your healthcare provider. Gun Safety- If you keep a gun in your home, keep it unloaded and with the safety lock on.  Bullets should be stored separately. Helmet use- Always wear a helmet when riding a motorcycle, bicycle, rollerblading or skateboarding. Safe sex- If you may be exposed to a sexually transmitted infection, use a condom Seat belts- Seat bels can save your life; always wear one. Smoke/Carbon Monoxide detectors- These detectors need to be installed on the appropriate level of your home.  Replace batteries at least once a year. Skin Cancer- When out in the sun, cover up and use sunscreen SPF 15 or higher. Violence- If anyone is threatening or hurting you, please tell your healthcare provider.

## 2021-09-30 NOTE — Progress Notes (Signed)
Patient ID: Brett Holden, male    DOB: June 23, 1970  MRN: [id number]  CC: Annual Physical Exam  Subjective: Brett Holden is a 51 y.o. male who presents for annual physical exam.   His concerns today include:  None.    Patient Active Problem List   Diagnosis Date Noted   Anxiety and depression 09/13/2021     No current outpatient medications on file prior to visit.   No current facility-administered medications on file prior to visit.    No Known Allergies  Social History   Socioeconomic History   Marital status: Single    Spouse name: Not on file   Number of children: Not on file   Years of education: Not on file   Highest education level: Not on file  Occupational History   Not on file  Tobacco Use   Smoking status: Every Day    Packs/day: 0.50    Types: Cigarettes    Passive exposure: Current   Smokeless tobacco: Never  Vaping Use   Vaping Use: Never used  Substance and Sexual Activity   Alcohol use: Yes    Alcohol/week: 2.0 standard drinks of alcohol    Types: 2 Cans of beer per week    Comment: Daily. Last drink: Yesterday    Drug use: Not Currently    Types: Cocaine   Sexual activity: Yes  Other Topics Concern   Not on file  Social History Narrative   Not on file   Social Determinants of Health   Financial Resource Strain: Not on file  Food Insecurity: Not on file  Transportation Needs: Not on file  Physical Activity: Not on file  Stress: Not on file  Social Connections: Not on file  Intimate Partner Violence: Not on file    No family history on file.  No past surgical history on file.  ROS: Review of Systems Negative except as stated above  PHYSICAL EXAM: BP 122/85 (BP Location: Left Arm, Patient Position: Sitting, Cuff Size: Normal)   Pulse 63   Temp 98.3 F (36.8 C)   Resp 16   Ht 5' 11.46" (1.815 m)   Wt 165 lb (74.8 kg)   SpO2 96%   BMI 22.72 kg/m   Physical Exam HENT:     Head: Normocephalic and  atraumatic.     Right Ear: Tympanic membrane, ear canal and external ear normal.     Left Ear: Tympanic membrane, ear canal and external ear normal.     Nose: Nose normal.     Mouth/Throat:     Mouth: Mucous membranes are moist.     Pharynx: Oropharynx is clear.  Eyes:     Extraocular Movements: Extraocular movements intact.     Conjunctiva/sclera: Conjunctivae normal.     Pupils: Pupils are equal, round, and reactive to light.  Cardiovascular:     Rate and Rhythm: Normal rate and regular rhythm.     Pulses: Normal pulses.     Heart sounds: Normal heart sounds.  Pulmonary:     Effort: Pulmonary effort is normal.     Breath sounds: Normal breath sounds.  Abdominal:     General: Bowel sounds are normal.     Palpations: Abdomen is soft.  Genitourinary:    Comments: Patient declined.  Musculoskeletal:        General: Normal range of motion.     Right shoulder: Normal.     Left shoulder: Normal.     Right upper arm: Normal.  Left upper arm: Normal.     Right elbow: Normal.     Left elbow: Normal.     Right forearm: Normal.     Left forearm: Normal.     Right wrist: Normal.     Left wrist: Normal.     Right hand: Normal.     Left hand: Normal.     Cervical back: Normal, normal range of motion and neck supple.     Thoracic back: Normal.     Lumbar back: Normal.     Right hip: Normal.     Left hip: Normal.     Right upper leg: Normal.     Left upper leg: Normal.     Right knee: Normal.     Left knee: Normal.     Right lower leg: Normal.     Left lower leg: Normal.     Right ankle: Normal.     Left ankle: Normal.     Right foot: Normal.     Left foot: Normal.  Skin:    General: Skin is warm and dry.     Capillary Refill: Capillary refill takes less than 2 seconds.  Neurological:     General: No focal deficit present.     Mental Status: He is alert and oriented to person, place, and time.  Psychiatric:        Mood and Affect: Mood normal.        Behavior: Behavior  normal.    ASSESSMENT AND PLAN: 1. Annual physical exam - Counseled on 150 minutes of exercise per week as tolerated, healthy eating (including decreased daily intake of saturated fats, cholesterol, added sugars, sodium), STI prevention, and routine healthcare maintenance.  2. Screening for metabolic disorder - TRR11+AFBX to check kidney function, liver function, and electrolyte balance.  - CMP14+EGFR  3. Screening for deficiency anemia - CBC to screen for anemia. - CBC  4. Diabetes mellitus screening - Hemoglobin A1c to screen for pre-diabetes/diabetes. - Hemoglobin A1c  5. Screening cholesterol level - Lipid panel to screen for high cholesterol.  - Lipid panel  6. Thyroid disorder screen - TSH to check thyroid function.  - TSH  7. Need for hepatitis C screening test - Hepatitis C antibody to screen for hepatitis C.  - Hepatitis C Antibody  8. Encounter for screening for HIV - HIV antibody to screen for human immunodeficiency virus.  - HIV antibody (with reflex)  9. Colon cancer screening - Screening for colon cancer by Cologuard.  - Cologuard    Patient was given the opportunity to ask questions.  Patient verbalized understanding of the plan and was able to repeat key elements of the plan. Patient was given clear instructions to go to Emergency Department or return to medical center if symptoms don't improve, worsen, or new problems develop.The patient verbalized understanding.   Orders Placed This Encounter  Procedures   HIV antibody (with reflex)   Hepatitis C Antibody   CBC   Lipid panel   TSH   CMP14+EGFR   Hemoglobin A1c   Cologuard     Return in about 1 year (around 10/10/2022) for Physical per patient preference.  Camillia Herter, NP

## 2021-10-09 ENCOUNTER — Ambulatory Visit (INDEPENDENT_AMBULATORY_CARE_PROVIDER_SITE_OTHER): Payer: Commercial Managed Care - HMO | Admitting: Family

## 2021-10-09 ENCOUNTER — Encounter: Payer: Self-pay | Admitting: Family

## 2021-10-09 VITALS — BP 122/85 | HR 63 | Temp 98.3°F | Resp 16 | Ht 71.46 in | Wt 165.0 lb

## 2021-10-09 DIAGNOSIS — Z1329 Encounter for screening for other suspected endocrine disorder: Secondary | ICD-10-CM

## 2021-10-09 DIAGNOSIS — Z1159 Encounter for screening for other viral diseases: Secondary | ICD-10-CM

## 2021-10-09 DIAGNOSIS — Z13228 Encounter for screening for other metabolic disorders: Secondary | ICD-10-CM

## 2021-10-09 DIAGNOSIS — Z0000 Encounter for general adult medical examination without abnormal findings: Secondary | ICD-10-CM | POA: Diagnosis not present

## 2021-10-09 DIAGNOSIS — Z1211 Encounter for screening for malignant neoplasm of colon: Secondary | ICD-10-CM

## 2021-10-09 DIAGNOSIS — Z114 Encounter for screening for human immunodeficiency virus [HIV]: Secondary | ICD-10-CM

## 2021-10-09 DIAGNOSIS — Z13220 Encounter for screening for lipoid disorders: Secondary | ICD-10-CM

## 2021-10-09 DIAGNOSIS — Z130 Encounter for screening for diseases of the blood and blood-forming organs and certain disorders involving the immune mechanism: Secondary | ICD-10-CM

## 2021-10-09 DIAGNOSIS — Z131 Encounter for screening for diabetes mellitus: Secondary | ICD-10-CM

## 2021-10-09 NOTE — Patient Instructions (Signed)

## 2021-10-09 NOTE — Progress Notes (Signed)
Pt presents for annual physical exam Pt would like to complete Cologuard test for colorectal screening

## 2021-10-10 LAB — CMP14+EGFR
ALT: 16 [IU]/L (ref 0–44)
AST: 23 [IU]/L (ref 0–40)
Albumin/Globulin Ratio: 1.8 (ref 1.2–2.2)
Albumin: 4.6 g/dL (ref 4.1–5.1)
Alkaline Phosphatase: 67 [IU]/L (ref 44–121)
BUN/Creatinine Ratio: 17 (ref 9–20)
BUN: 14 mg/dL (ref 6–24)
Bilirubin Total: 0.3 mg/dL (ref 0.0–1.2)
CO2: 20 mmol/L (ref 20–29)
Calcium: 9.5 mg/dL (ref 8.7–10.2)
Chloride: 100 mmol/L (ref 96–106)
Creatinine, Ser: 0.81 mg/dL (ref 0.76–1.27)
Globulin, Total: 2.6 g/dL (ref 1.5–4.5)
Glucose: 88 mg/dL (ref 70–99)
Potassium: 4.2 mmol/L (ref 3.5–5.2)
Sodium: 140 mmol/L (ref 134–144)
Total Protein: 7.2 g/dL (ref 6.0–8.5)
eGFR: 107 mL/min/{1.73_m2}

## 2021-10-10 LAB — HEPATITIS C ANTIBODY: Hep C Virus Ab: NONREACTIVE

## 2021-10-10 LAB — HIV ANTIBODY (ROUTINE TESTING W REFLEX): HIV Screen 4th Generation wRfx: NONREACTIVE

## 2021-10-10 LAB — LIPID PANEL
Chol/HDL Ratio: 3.4 ratio (ref 0.0–5.0)
Cholesterol, Total: 217 mg/dL — ABNORMAL HIGH (ref 100–199)
HDL: 64 mg/dL
LDL Chol Calc (NIH): 134 mg/dL — ABNORMAL HIGH (ref 0–99)
Triglycerides: 107 mg/dL (ref 0–149)
VLDL Cholesterol Cal: 19 mg/dL (ref 5–40)

## 2021-10-10 LAB — TSH: TSH: 1.48 u[IU]/mL (ref 0.450–4.500)

## 2021-10-10 LAB — CBC
Hematocrit: 41.1 % (ref 37.5–51.0)
Hemoglobin: 14.2 g/dL (ref 13.0–17.7)
MCH: 33.3 pg — ABNORMAL HIGH (ref 26.6–33.0)
MCHC: 34.5 g/dL (ref 31.5–35.7)
MCV: 96 fL (ref 79–97)
Platelets: 344 10*3/uL (ref 150–450)
RBC: 4.27 x10E6/uL (ref 4.14–5.80)
RDW: 12.3 % (ref 11.6–15.4)
WBC: 7.4 10*3/uL (ref 3.4–10.8)

## 2021-10-10 LAB — HEMOGLOBIN A1C
Est. average glucose Bld gHb Est-mCnc: 111 mg/dL
Hgb A1c MFr Bld: 5.5 % (ref 4.8–5.6)

## 2021-11-07 ENCOUNTER — Encounter (HOSPITAL_COMMUNITY): Payer: Self-pay

## 2021-11-07 ENCOUNTER — Ambulatory Visit (INDEPENDENT_AMBULATORY_CARE_PROVIDER_SITE_OTHER): Payer: Commercial Managed Care - HMO | Admitting: Licensed Clinical Social Worker

## 2021-11-07 DIAGNOSIS — F411 Generalized anxiety disorder: Secondary | ICD-10-CM | POA: Diagnosis not present

## 2021-11-07 DIAGNOSIS — Z636 Dependent relative needing care at home: Secondary | ICD-10-CM

## 2021-11-07 DIAGNOSIS — F321 Major depressive disorder, single episode, moderate: Secondary | ICD-10-CM | POA: Diagnosis not present

## 2021-11-07 NOTE — Progress Notes (Signed)
Comprehensive Clinical Assessment (CCA) Note  11/07/2021 Brett Holden [id number]  Behavioral Health Outpatient Brett Holden in office visit for patient and LCSW clinician  Chief Complaint:  Chief Complaint  Patient presents with   Establish Care   Depression   Anxiety   Visit Diagnosis:  Encounter Diagnoses  Name Primary?   MDD (major depressive disorder), single episode, moderate (HCC) Yes   GAD (generalized anxiety disorder)    Caregiver stress     CCA Screening, Triage and Referral (STR)  Patient Reported Information How did you hear about [place]? Self  Referral name: Brett Holden  Referral phone number: No data recorded  Whom do you see for routine medical problems? Primary Care  Practice/Facility Name: No data recorded Practice/Facility Phone Number: No data recorded Name of Contact: No data recorded Contact Number: No data recorded Contact Fax Number: No data recorded Prescriber Name: No data recorded Prescriber Address (if known): No data recorded  What Is the Reason for Your Visit/Call Today? Brett Holden is a 51 year old male reporting to [facility] for establishment of outpatient psychotherapy services. Patient reports that he has never had counseling or psychiatric evaluation/treatment in the past. Patient reports that he is experiencing extreme depression and anxiety symptoms including low motivation, feeling hopeless, feeling tired and no energy, trouble focusing and concentrating, feeling only edge, insomnia most nights, worrying, and extreme irritability and agitation. Patient reports that he is the primary caregiver to his mother. Patient reports that he does all of the cooking in the house, cleaning in the house, taking care of household pets, and taking care of his mom. Patient reports that he does not have any support people, any respite time at all, and rarely leaves the house. Patient reports that he does have a brother that lives in [place] that  patient wants to send the mother to for future care. Patient reports that he is originally from [place] and has three children and two grandchildren that are currently living in [place]. Patient reports that he moved to [place] in 1998. Reports that his mother often tells him that she thinks that he is bipolar and that he has multiple personalities. Patient is not aware of what she is talking about. Patient denies any current suicidal ideation, homicidal ideation, or any perceptual disturbances. Patient reports that he is a daily alcohol drinker--both beer and liquor. Patient denies any additional substance use. 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  How Long Has This Been Causing You Problems? > than 6 months  What Do You Feel Would Help You the Most Today? Treatment for Depression or other mood problem   Have You Recently Been in Any Inpatient Treatment ([facility]/Detox/Crisis Center/28-Day Program)? No  Name/Location of Program/[facility]en:No data recorded How Long Were You There? No data recorded When Were You Discharged? No data recorded  Have You Ever Received Services From [facility] Before? Yes  Who Do You See at [facility]? No data recorded  Have You Recently Had Any Thoughts About Hurting Yourself? No  Are You Planning to Commit Suicide/Harm Yourself At This time? No   Have you Recently Had Thoughts About Hurting Someone Brett Holden? No  Explanation: No data recorded  Have You Used Any Alcohol or Drugs in the Past 24 Hours? Yes  How Long Ago Did You Use Drugs or Alcohol? No data recorded What Did You Use and How Much? No data recorded  Do You Currently Have a Therapist/Psychiatrist? No  Name of Therapist/Psychiatrist: No data recorded  Have You Been Recently Discharged From Any Office Practice or Programs? No  Explanation of Discharge From Practice/Program: No data recorded    CCA Screening Triage Referral Assessment Type of  Contact: Face-to-Face  Is this Initial or Reassessment? No data recorded Date Telepsych consult ordered in [facility]:  No data recorded Time Telepsych consult ordered in [facility]:  No data recorded  Patient Reported Information Reviewed? No data recorded Patient Left Without Being Seen? No data recorded Reason for Not Completing Assessment: No data recorded  Collateral Involvement: EPIC review   Does Patient Have a Court Appointed Legal Guardian? No data recorded Name and Contact of Legal Guardian: No data recorded If Minor and Not Living with Parent(s), Who has Custody? No data recorded Is CPS involved or ever been involved? Never  Is APS involved or ever been involved? Never   Patient Determined To Be At Risk for Harm To Self or Others Based on Review of Patient Reported Information or Presenting Complaint? No  Method: No data recorded Availability of Means: No data recorded Intent: No data recorded Notification Required: No data recorded Additional Information for Danger to Others Potential: No data recorded Additional Comments for Danger to Others Potential: No data recorded Are There Guns or Other Weapons in Your Home? No data recorded Types of Guns/Weapons: No data recorded Are These Weapons Safely Secured?                            No data recorded Who Could Verify You Are Able To Have These Secured: No data recorded Do You Have any Outstanding Charges, Pending Court Dates, Parole/Probation? No data recorded Contacted To Inform of Risk of Harm To Self or Others: No data recorded  Location of Assessment: Other (comment)   Does Patient Present under Involuntary Commitment? No  IVC Papers Initial File Date: No data recorded  [place] of Residence: [place]   Patient Currently Receiving the Following Services: Not Receiving Services   Determination of Need: Routine (7 days)   Options For Referral: Medication Management; Outpatient Therapy     CCA  Biopsychosocial Intake/Chief Complaint:  severe depression and anxiety "I just stay in the bed all day long and only get up to cook for my mom"  Current Symptoms/Problems: low mood, anhedonia, feeling hopeless/worthless, low energy, poor appetite, mind racing, feeling nervous, worrying, feeling restless   Patient Reported Schizophrenia/Schizoaffective Diagnosis in Past: No   Strengths: No data recorded Preferences: outpatient psychiatric supports  Abilities: No data recorded  Type of Services Patient Feels are Needed: psychotherapy   Initial Clinical Notes/Concerns: No data recorded  Mental Health Symptoms Depression:  Change in energy/activity; Difficulty Concentrating; Fatigue; Hopelessness; Increase/decrease in appetite; Irritability; Sleep (too much or little); Tearfulness; Weight gain/loss; Worthlessness   Duration of Depressive symptoms: Greater than two weeks   Mania:  Irritability; Racing thoughts   Anxiety:   Difficulty concentrating; Irritability; Restlessness; Sleep; Tension; Worrying; Fatigue   Psychosis:  None   Duration of Psychotic symptoms: No data recorded  Trauma:  None   Obsessions:  None   Compulsions:  None   Inattention:  None   Hyperactivity/Impulsivity:  None   Oppositional/Defiant Behaviors:  Aggression towards people/animals; Argumentative (pt states he gets angry with his mother)   Emotional Irregularity:  Mood lability   Other Mood/Personality Symptoms:  No data recorded   Mental Status Exam Appearance and self-care  Stature:  Average   Weight:  Average weight   Clothing:  Casual   Grooming:  Normal   Cosmetic use:  None   Posture/gait:  Normal   Motor activity:  Restless; Agitated   Sensorium  Attention:  Distractible   Concentration:  Preoccupied   Orientation:  X5   Recall/memory:  Normal   Affect and Mood  Affect:  Anxious; Depressed   Mood:  Anxious; Depressed   Relating  Eye contact:  Staring   Facial  expression:  Anxious; Depressed; Tense   Attitude toward examiner:  Cooperative   Thought and Language  Speech flow: Flight of Ideas; Garbled; Soft   Thought content:  Appropriate to Mood and Circumstances   Preoccupation:  None   Hallucinations:  None   Organization:  No data recorded  [employer] of Knowledge:  Good   Intelligence:  Average   Abstraction:  Normal   Judgement:  Fair   Reality Testing:  Variable   Insight:  Fair   Decision Making:  Vacilates   Social Functioning  Social Maturity:  Isolates   Social Judgement:  "Street Smart"   Stress  Stressors:  Family conflict; Illness (worry about health of mother)   Coping Ability:  Overwhelmed; Deficient supports   Skill Deficits:  Interpersonal   Supports:  Support needed     Religion: Religion/Spirituality Are You A Religious Person?: No  Leisure/Recreation: Leisure / Recreation Do You Have Hobbies?: No (pt says he doesn't do anything other than cook)  Exercise/Diet: Exercise/Diet Do You Exercise?: No Have You Gained or Lost A Significant Amount of Weight in the Past Six Months?: No Do You Follow a Special Diet?: No Do You Have Any Trouble Sleeping?: Yes Explanation of Sleeping Difficulties: pt reports that he wakes up frequently--only gets 1-2 consecutive hours of sleep   CCA Employment/Education Employment/Work Situation: Employment / Work Situation Employment Situation: Unemployed Has Patient ever Been in Equities trader?: Yes (Describe in comment) (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) Did You Receive Any Psychiatric Treatment/Services While in the Military?: No  Education: Education Is Patient Currently Attending School?: No Last Grade Completed: 12 Did Garment/textile technologist From [employer]?: Yes Did You Attend College?: No Did You Attend Graduate School?: No Did You Have An Individualized Education Program (IIEP): No Did You Have Any Difficulty At School?: No Patient's Education  Has Been Impacted by Current Illness: No   CCA Family/Childhood History Family and Relationship History: Family history Marital status: Single  Childhood History:  Childhood History By whom was/is the patient raised?: Mother Additional childhood history information: pt reports that him and brother were raised in [place]--moved to [place] in 1998. Brother currently lives in [place].  Pt states he [place] up  "on [place] streets" of [place] and "nobody messed with me" Description of patient's relationship with caregiver when they were a child: stable Patient's description of current relationship with people who raised him/her: pt is currently primary caregiver to his mother--she is disabled and pt cooks x 3 per day, cleans house, and takes care of his mother Does patient have siblings?: Yes Number of Siblings: 1 Description of patient's current relationship with siblings: brother in [place] Did patient suffer any verbal/emotional/physical/sexual abuse as a child?: No Did patient suffer from severe childhood neglect?: No (PT STATES NO BUT SAYS HE WAS RAISED ON STREETS AND "PUT OUT") Has patient ever been sexually abused/assaulted/raped as an adolescent or adult?: No Was the patient ever a victim of a crime or a disaster?: No Witnessed domestic violence?: No  Has patient been affected by domestic violence as an adult?: No  Child/Adolescent Assessment:  N/a   CCA Substance Use Alcohol/Drug Use: Alcohol / Drug Use Pain Medications: SEE MAR Prescriptions: SEE MAR Over the Counter: SEE MAR History of alcohol / drug use?: Yes Negative Consequences of Use:  (NO PERIODS OF SOBRIETY SINCE AGE 41) Withdrawal Symptoms: None (PT HAS NEVER STOPPED DRINKING) Substance #1 Name of Substance 1: ETOH 1 - Age of First Use: 19 1 - Amount (size/oz): VARIABLE-- PT STATES "A LOT" 1 - Frequency: DAILY 1 - Duration: SINCE AGE 41 1 - Last Use / Amount: YESTERDAY 1 - Method of Aquiring: LEGAL 1- Route of Use: ORAL/DRINK      ASAM's:  Six Dimensions of Multidimensional Assessment  Dimension 1:  Acute Intoxication and/or Withdrawal Potential:   Dimension 1:  Description of individual's past and current experiences of substance use and withdrawal: DAILY ETOH USE  Dimension 2:  Biomedical Conditions and Complications:      Dimension 3:  Emotional, Behavioral, or Cognitive Conditions and Complications:  Dimension 3:  Description of emotional, behavioral, or cognitive conditions and complications: DEPRESSION, ANXIETY,  Dimension 4:  Readiness to Change:  Dimension 4:  Description of Readiness to Change criteria: PT WILLING TO ENGAGE  Dimension 5:  Relapse, Continued use, or Continued Problem Potential:     Dimension 6:  Recovery/Living Environment:     ASAM Severity Score: ASAM's Severity Rating Score: 2  ASAM Recommended Level of Treatment: ASAM Recommended Level of Treatment: Level I Outpatient Treatment   Substance use Disorder (SUD) Substance Use Disorder (SUD)  Checklist Symptoms of Substance Use: Substance(s) often taken in larger amounts or over longer times than was intended  Recommendations for Services/Supports/Treatments: Recommendations for Services/Supports/Treatments Recommendations For Services/Supports/Treatments: Individual Therapy, Medication Management  DSM5 Diagnoses: Patient Active Problem List   Diagnosis Date Noted   Anxiety and depression 09/13/2021    Patient Centered Plan: Patient is on the following Treatment Plan(s):  Anxiety and Depression   Referrals to Alternative Service(s): Referred to Alternative Service(s):   Place:   Date:   Time:    Referred to Alternative Service(s):   Place:   Date:   Time:    Referred to Alternative Service(s):   Place:   Date:   Time:    Referred to Alternative Service(s):   Place:   Date:   Time:      Collaboration of Care: Other pt referred to psychiatrist for psychiatric evaluation for possible medication management of  symptoms  Patient/Guardian was advised Release of Information must be obtained prior to any record release in order to collaborate their care with an outside provider. Patient/Guardian was advised if they have not already done so to contact the registration department to sign all necessary forms in order for [place] to release information regarding their care.   Consent: Patient/Guardian gives verbal consent for treatment and assignment of benefits for services provided during this visit. Patient/Guardian expressed understanding and agreed to proceed.   Jizel Cheeks R Da Michelle, LCSW

## 2021-11-07 NOTE — Plan of Care (Signed)
bx

## 2021-11-13 ENCOUNTER — Ambulatory Visit (HOSPITAL_COMMUNITY): Payer: Commercial Managed Care - HMO | Admitting: Psychiatry

## 2021-11-27 ENCOUNTER — Ambulatory Visit (HOSPITAL_BASED_OUTPATIENT_CLINIC_OR_DEPARTMENT_OTHER): Payer: Commercial Managed Care - HMO | Admitting: Psychiatry

## 2021-11-27 ENCOUNTER — Encounter (HOSPITAL_COMMUNITY): Payer: Self-pay | Admitting: Psychiatry

## 2021-11-27 DIAGNOSIS — F39 Unspecified mood [affective] disorder: Secondary | ICD-10-CM | POA: Diagnosis not present

## 2021-11-27 MED ORDER — DIVALPROEX SODIUM ER 250 MG PO TB24: ORAL_TABLET | ORAL | 0 refills | Status: DC
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
## 2021-12-25 ENCOUNTER — Ambulatory Visit (HOSPITAL_COMMUNITY): Payer: Commercial Managed Care - HMO | Admitting: Licensed Clinical Social Worker

## 2021-12-30 ENCOUNTER — Encounter (HOSPITAL_COMMUNITY): Payer: Self-pay | Admitting: Psychiatry

## 2021-12-30 ENCOUNTER — Ambulatory Visit (HOSPITAL_BASED_OUTPATIENT_CLINIC_OR_DEPARTMENT_OTHER): Payer: Commercial Managed Care - HMO | Admitting: Psychiatry

## 2021-12-30 VITALS — BP 138/87 | HR 90 | Temp 99.0°F | Ht 72.0 in | Wt 173.0 lb

## 2021-12-30 DIAGNOSIS — F39 Unspecified mood [affective] disorder: Secondary | ICD-10-CM

## 2021-12-30 DIAGNOSIS — F321 Major depressive disorder, single episode, moderate: Secondary | ICD-10-CM

## 2021-12-30 DIAGNOSIS — F411 Generalized anxiety disorder: Secondary | ICD-10-CM

## 2021-12-30 MED ORDER — DIVALPROEX SODIUM ER 250 MG PO TB24: ORAL_TABLET | ORAL | 0 refills | Status: DC

## 2021-12-30 NOTE — Progress Notes (Signed)
BH MD/PA/NP OP Progress Note  12/30/2021 2:46 PM Brett Holden  MRN:  [id number]  Visit Diagnosis:    ICD-10-CM   1. Unspecified mood (affective) disorder (HCC)  F39 divalproex (DEPAKOTE ER) 250 MG 24 hr tablet    2. MDD (major depressive disorder), single episode, moderate (HCC)  F32.1     3. GAD (generalized anxiety disorder)  F41.1     4. Caregiver stress  Z63.6       Assessment: Brett Holden is a 51 y.o. y.o. male with no prior psychiatric history who presents in person to [facility] at [facility] for initial evaluation and establishment of care.    Patient reports a history of depressed mood, anhedonia, amotivation, fatigue, insomnia, difficulty focusing, extreme irritability and agitation.  Patient reports experiencing these symptoms for 10 years now and endorses mood lability from depressed to happy to irritable occurring at least once a day.  Patient denies any SI/HI or thoughts of self-harm.  He also denies any history of mania or auditory/visual hallucinations.  Patient does endorse alcohol use drinking 1-2 drinks of either beer or liquor a night.  Alcohol cessation counseling was provided. Of note patient endorses that he prefers to be alone and does not necessarily see anything wrong with that.  He also notes that he does not trust anyone and is suspicious of people when he interacts with them.  Patient reports that his father behaved in a similar manner.  At this time patient appears to have unspecified mood disorder with further clarity being needed to differentiate between MDD versus cyclothymia.  He also appears to have multiple personality traits consistent with schizoid versus paranoid personality disorder.  Further evaluation is needed to confirm a diagnosis.  At this time we will start patient on Depakote for mood stabilization.  Risk and benefits were discussed.  Brett Holden presents for follow-up evaluation. Today, 12/30/21,  patient reports no changes over the past month as he was unable to start the medication due to the pharmacy not providing it, and he canceled his therapy follow-up due to confusion about why he was seeing a psychiatrist and a therapist.  We explained the point of a psychiatrist and a therapist and work to remedy the pharmacy issue.  Pharmacy was contacted and informed the provider that the prescription should be able to be filled as long as he brings his insurance information.  Patient was open to rescheduling a therapy appointment and plans to go to the pharmacy after this appointment.   Plan: - Start Depakote 250 mg QD and increase to 500 mg in 2 weeks for mood stabilization -Liver enzymes, lipid profile, and TSH within normal limits - Continue therapy - Follow up in a month  Chief Complaint:  Chief Complaint  Patient presents with   Follow-up   Medication Refill   HPI: Brett Holden reports that he has just been trying to get by the past month and nothing has really changed. He did not start the medication reporting that the pharmacy did not fill it due to the dose being unclear. He said the pharmacy was supposed to fax something, but nothing was received at the office. Patient also noted that he tried to call but got frustrated with the menus and hung up the phone. Brett Holden notes that he also cancelled his therapy appointment as he was confused why he as seeing two providers.   We discussed with the patient the purpose of continuing with therapy and medications and  benefits of both. Also encouraged patient to reach out to the office if there is an issue with his medications so we can try to fix the problem.  Past Psychiatric History: Patient denies any prior psychiatric history.  Connecting with the therapist this past week was his first foray into the field.  He denies ever being on any medications. Patient reports that he is a daily alcohol drinker--both beer and liquor.  He reports that  he has 1-2 drinks a night typically.  Patient denies any additional substance use.    Past Medical History: No past medical history on file. No past surgical history on file.  Family Psychiatric History: Denies  Family History: No family history on file.  Social History:  Social History   Socioeconomic History   Marital status: Single    Spouse name: Not on file   Number of children: Not on file   Years of education: Not on file   Highest education level: Not on file  Occupational History   Not on file  Tobacco Use   Smoking status: Every Day    Packs/day: 0.50    Types: Cigarettes    Passive exposure: Current   Smokeless tobacco: Never  Vaping Use   Vaping Use: Never used  Substance and Sexual Activity   Alcohol use: Yes    Alcohol/week: 2.0 standard drinks of alcohol    Types: 2 Cans of beer per week    Comment: Daily. Last drink: Yesterday    Drug use: Not Currently    Types: Cocaine   Sexual activity: Yes  Other Topics Concern   Not on file  Social History Narrative   Not on file   Social Determinants of Health   Financial Resource Strain: Not on file  Food Insecurity: Not on file  Transportation Needs: Not on file  Physical Activity: Not on file  Stress: Not on file  Social Connections: Not on file    Allergies: No Known Allergies  Current Medications: Current Outpatient Medications  Medication Sig Dispense Refill   divalproex (DEPAKOTE ER) 250 MG 24 hr tablet Take 1 tablet (250 mg total) by mouth daily for 14 days, THEN 2 tablets (500 mg total) daily for 16 days. 45 tablet 0   No current facility-administered medications for this visit.     Musculoskeletal: Strength & Muscle Tone: within normal limits Gait & Station: normal Patient leans: N/A  Psychiatric Specialty Exam: Review of Systems  Blood pressure 138/87, pulse 90, temperature 99 F (37.2 C), temperature source Oral, height 6' (1.829 m), weight 173 lb (78.5 kg).Body mass index is  23.46 kg/m.  General Appearance: Casual and Fairly Groomed  Eye Contact:  Good  Speech:  Clear and Coherent  Volume:  Normal  Mood:  Irritable  Affect:  Congruent  Thought Process:  Coherent  Orientation:  Full (Time, Place, and Person)  Thought Content: Logical   Suicidal Thoughts:  No  Homicidal Thoughts:  No  Memory:  NA  Judgement:  Fair  Insight:  Fair  Psychomotor Activity:  Normal  Concentration:  Concentration: NA  Recall:  [employer] of Knowledge: Fair  Language: Good  Akathisia:  NA    AIMS (if indicated): not done  Assets:  Housing Physical Health Talents/Skills  ADL's:  Intact  Cognition: WNL  Sleep:  Good   Metabolic Disorder Labs: Lab Results  Component Value Date   HGBA1C 5.5 10/09/2021   No results found for: "PROLACTIN" Lab Results  Component Value Date  CHOL 217 (H) 10/09/2021   TRIG 107 10/09/2021   HDL 64 10/09/2021   CHOLHDL 3.4 10/09/2021   LDLCALC 134 (H) 10/09/2021   Lab Results  Component Value Date   TSH 1.480 10/09/2021    Therapeutic Level Labs: No results found for: "LITHIUM" No results found for: "VALPROATE" No results found for: "CBMZ"   Screenings: GAD-7    Flowsheet Row Counselor from 11/07/2021 in [place] Office Visit from 09/13/2021 in Primary Care at [facility]  Total GAD-7 Score 21 21      PHQ2-9    [place] Office Visit from 11/27/2021 in [facility] Counselor from 11/07/2021 in [place] Office Visit from 10/09/2021 in Primary Care at [place] Visit from 09/13/2021 in Primary Care at [facility] Total Score 3 6 0 6  PHQ-9 Total Score 11 21 0 24      [place] Visit from 11/27/2021 in [facility] Counselor from 11/07/2021 in [place] ED from 08/27/2021 in [place] [facility]  at [place] No Risk No Risk No Risk       Collaboration of Care: Collaboration of Care: Medication Management AEB medication prescription  Patient/Guardian was advised Release of Information must be obtained prior to any record release in order to collaborate their care with an outside provider. Patient/Guardian was advised if they have not already done so to contact the registration department to sign all necessary forms in order for [place] to release information regarding their care.   Consent: Patient/Guardian gives verbal consent for treatment and assignment of benefits for services provided during this visit. Patient/Guardian expressed understanding and agreed to proceed.    Vista Mink, MD 12/30/2021, 2:46 PM

## 2022-01-08 ENCOUNTER — Ambulatory Visit (HOSPITAL_COMMUNITY): Payer: Commercial Managed Care - HMO | Admitting: Licensed Clinical Social Worker

## 2022-02-03 ENCOUNTER — Ambulatory Visit (HOSPITAL_COMMUNITY): Payer: Commercial Managed Care - HMO | Admitting: Psychiatry

## 2022-02-12 ENCOUNTER — Emergency Department (HOSPITAL_BASED_OUTPATIENT_CLINIC_OR_DEPARTMENT_OTHER): Payer: Commercial Managed Care - HMO

## 2022-02-12 ENCOUNTER — Emergency Department (HOSPITAL_BASED_OUTPATIENT_CLINIC_OR_DEPARTMENT_OTHER)
Admission: EM | Admit: 2022-02-12 | Discharge: 2022-02-12 | Disposition: A | Discharge status: Home / Self Care | Payer: Commercial Managed Care - HMO | Attending: Emergency Medicine | Admitting: Emergency Medicine

## 2022-02-12 ENCOUNTER — Other Ambulatory Visit: Payer: Self-pay

## 2022-02-12 ENCOUNTER — Encounter: Payer: Self-pay | Admitting: Physician Assistant

## 2022-02-12 ENCOUNTER — Ambulatory Visit
Admission: EM | Admit: 2022-02-12 | Discharge: 2022-02-12 | Disposition: A | Discharge status: Home / Self Care | Payer: Commercial Managed Care - HMO | Attending: Internal Medicine | Admitting: Internal Medicine

## 2022-02-12 ENCOUNTER — Encounter (HOSPITAL_BASED_OUTPATIENT_CLINIC_OR_DEPARTMENT_OTHER): Payer: Self-pay | Admitting: Emergency Medicine

## 2022-02-12 DIAGNOSIS — R1032 Left lower quadrant pain: Secondary | ICD-10-CM | POA: Diagnosis not present

## 2022-02-12 DIAGNOSIS — R3989 Other symptoms and signs involving the genitourinary system: Secondary | ICD-10-CM

## 2022-02-12 LAB — POCT URINALYSIS DIP (MANUAL ENTRY)
Bilirubin, UA: NEGATIVE
Blood, UA: NEGATIVE
Glucose, UA: NEGATIVE mg/dL
Ketones, POC UA: NEGATIVE mg/dL
Leukocytes, UA: NEGATIVE
Nitrite, UA: NEGATIVE
Protein Ur, POC: NEGATIVE mg/dL
Spec Grav, UA: 1.025 (ref 1.010–1.025)
Urobilinogen, UA: 0.2 E.U./dL
pH, UA: 6 (ref 5.0–8.0)

## 2022-02-12 LAB — URINALYSIS, ROUTINE W REFLEX MICROSCOPIC
Bilirubin Urine: NEGATIVE
Glucose, UA: NEGATIVE mg/dL
Hgb urine dipstick: NEGATIVE
Ketones, ur: NEGATIVE mg/dL
Leukocytes,Ua: NEGATIVE
Nitrite: NEGATIVE
Protein, ur: NEGATIVE mg/dL
Specific Gravity, Urine: 1.022 (ref 1.005–1.030)
pH: 5.5 (ref 5.0–8.0)

## 2022-02-12 LAB — CBC
HCT: 44.9 % (ref 39.0–52.0)
Hemoglobin: 14.8 g/dL (ref 13.0–17.0)
MCH: 32.2 pg (ref 26.0–34.0)
MCHC: 33 g/dL (ref 30.0–36.0)
MCV: 97.6 fL (ref 80.0–100.0)
Platelets: 359 10*3/uL (ref 150–400)
RBC: 4.6 MIL/uL (ref 4.22–5.81)
RDW: 12.9 % (ref 11.5–15.5)
WBC: 9 10*3/uL (ref 4.0–10.5)
nRBC: 0 % (ref 0.0–0.2)

## 2022-02-12 LAB — COMPREHENSIVE METABOLIC PANEL
ALT: 19 U/L (ref 0–44)
AST: 19 U/L (ref 15–41)
Albumin: 4.7 g/dL (ref 3.5–5.0)
Alkaline Phosphatase: 58 U/L (ref 38–126)
Anion gap: 9 (ref 5–15)
BUN: 12 mg/dL (ref 6–20)
CO2: 27 mmol/L (ref 22–32)
Calcium: 9.7 mg/dL (ref 8.9–10.3)
Chloride: 103 mmol/L (ref 98–111)
Creatinine, Ser: 0.81 mg/dL (ref 0.61–1.24)
GFR, Estimated: >60 mL/min (ref >60)
Glucose, Bld: 137 mg/dL — ABNORMAL HIGH (ref 70–99)
Potassium: 3.8 mmol/L (ref 3.5–5.1)
Sodium: 139 mmol/L (ref 135–145)
Total Bilirubin: 0.4 mg/dL (ref 0.3–1.2)
Total Protein: 8 g/dL (ref 6.5–8.1)

## 2022-02-12 LAB — LIPASE, BLOOD: Lipase: 32 U/L (ref 11–51)

## 2022-02-12 MED ORDER — HYDROCODONE-ACETAMINOPHEN 5-325 MG PO TABS: 2.0000 | ORAL_TABLET | ORAL | 0 refills | Status: DC | PRN

## 2022-02-12 MED ORDER — MORPHINE SULFATE (PF) 4 MG/ML IV SOLN
4.0000 mg | Freq: Once | INTRAVENOUS | Status: AC
  Administered 2022-02-12: 4 mg via INTRAVENOUS
  Filled 2022-02-12: qty 1

## 2022-02-12 MED ORDER — OXYBUTYNIN CHLORIDE ER 10 MG PO TB24: 10.0000 mg | ORAL_TABLET | Freq: Every day | ORAL | 0 refills | Status: DC

## 2022-02-12 MED ORDER — IOHEXOL 300 MG/ML  SOLN
100.0000 mL | Freq: Once | INTRAMUSCULAR | Status: AC | PRN
  Administered 2022-02-12: 80 mL via INTRAVENOUS

## 2022-02-12 MED ORDER — SODIUM CHLORIDE 0.9 % IV BOLUS
1000.0000 mL | Freq: Once | INTRAVENOUS | Status: AC
  Administered 2022-02-12: 1000 mL via INTRAVENOUS

## 2022-02-12 MED ORDER — ONDANSETRON HCL 4 MG/2ML IJ SOLN
4.0000 mg | Freq: Once | INTRAMUSCULAR | Status: AC
  Administered 2022-02-12: 4 mg via INTRAVENOUS
  Filled 2022-02-12: qty 2

## 2022-02-12 NOTE — ED Notes (Signed)
Pt aware of need for a ride upon leaving the hospital.

## 2022-02-12 NOTE — Discharge Instructions (Signed)
Get help right away if: Your pain does not go away as soon as your health care provider told you to expect. You cannot stop vomiting. Your pain is only in areas of the abdomen, such as the right side or the left lower portion of the abdomen. Pain on the right side could be caused by appendicitis. You have bloody or black stools, or stools that look like tar. You have severe pain, cramping, or bloating in your abdomen. You have signs of dehydration, such as: Dark urine, very little urine, or no urine. Cracked lips. Dry mouth. Sunken eyes. Sleepiness. Weakness. You have trouble breathing or chest pain. 

## 2022-02-12 NOTE — ED Notes (Signed)
Patient is being discharged from the Urgent Care and sent to the Emergency Department via self . Per Lurena Joiner, patient is in need of higher level of care due to severe abd pain. Patient is aware and verbalizes understanding of plan of care.  Vitals:   02/12/22 1517  BP: 121/75  Pulse: 68  Resp: 16  Temp: 98.2 F (36.8 C)  SpO2: 98%

## 2022-02-12 NOTE — Discharge Instructions (Signed)
  Please report to   [facility]  [address]  [place] [address]

## 2022-02-12 NOTE — ED Provider Notes (Signed)
[facility] EMERGENCY DEPT Provider Note   CSN: [id number] Arrival date & time: 02/12/22  1631     History  Chief Complaint  Patient presents with   Abdominal Pain    Brett Holden is a 51 y.o. male who presents emergency department with a chief complaint of left lower quadrant abdominal pain.  He describes the pain as severe, sharp, radiating to his back.  He also has associated symptoms of urgency and dysuria.  He has never had any pain like this before.  Nothing seems to make it worse or better.  He took ibuprofen without relief of his symptoms.  He has no other medical problems that he knows about.  He has not had any vomiting or diarrhea.   Abdominal Pain      Home Medications Prior to Admission medications   Medication Sig Start Date End Date Taking? Authorizing Provider  HYDROcodone-acetaminophen (NORCO) 5-325 MG tablet Take 2 tablets by mouth every 4 (four) hours as needed. 02/12/22  Yes Carmela Piechowski, PA-C  oxybutynin (DITROPAN XL) 10 MG 24 hr tablet Take 1 tablet (10 mg total) by mouth at bedtime. 02/12/22  Yes Laren Orama, PA-C  divalproex (DEPAKOTE ER) 250 MG 24 hr tablet Take 1 tablet (250 mg total) by mouth daily for 14 days, THEN 2 tablets (500 mg total) daily for 16 days. 12/30/21 01/29/22  Stasia Cavalier, MD      Allergies    Patient has no known allergies.    Review of Systems   Review of Systems  Gastrointestinal:  Positive for abdominal pain.    Physical Exam Updated Vital Signs BP 124/87 (BP Location: Left Arm)   Pulse (!) 58   Temp 97.9 F (36.6 C) (Oral)   Resp 18   SpO2 100%  Physical Exam Vitals and nursing note reviewed.  Constitutional:      General: He is not in acute distress.    Appearance: He is well-developed. He is not diaphoretic.  HENT:     Head: Normocephalic and atraumatic.  Eyes:     General: No scleral icterus.    Conjunctiva/sclera: Conjunctivae normal.  Cardiovascular:     Rate and Rhythm:  Normal rate and regular rhythm.     Heart sounds: Normal heart sounds.  Pulmonary:     Effort: Pulmonary effort is normal. No respiratory distress.     Breath sounds: Normal breath sounds.  Abdominal:     Palpations: Abdomen is soft.     Tenderness: There is abdominal tenderness in the left lower quadrant. There is no right CVA tenderness or left CVA tenderness.  Musculoskeletal:     Cervical back: Normal range of motion and neck supple.  Skin:    General: Skin is warm and dry.  Neurological:     Mental Status: He is alert.  Psychiatric:        Behavior: Behavior normal.     ED Results / Procedures / Treatments   Labs (all labs ordered are listed, but only abnormal results are displayed) Labs Reviewed  COMPREHENSIVE METABOLIC PANEL - Abnormal; Notable for the following components:      Result Value   Glucose, Bld 137 (*)    All other components within normal limits  LIPASE, BLOOD  CBC  URINALYSIS, ROUTINE W REFLEX MICROSCOPIC    EKG None  Radiology CT Abdomen Pelvis W Contrast  Result Date: 02/12/2022 CLINICAL DATA:  Abdominal pain, acute, nonlocalized. EXAM: CT ABDOMEN AND PELVIS WITH CONTRAST TECHNIQUE: Multidetector CT imaging  of the abdomen and pelvis was performed using the standard protocol following bolus administration of intravenous contrast. RADIATION DOSE REDUCTION: This exam was performed according to the departmental dose-optimization program which includes automated exposure control, adjustment of the mA and/or kV according to patient size and/or use of iterative reconstruction technique. CONTRAST:  97mL OMNIPAQUE IOHEXOL 300 MG/ML  SOLN COMPARISON:  None Available. FINDINGS: Lower chest: No acute abnormality. Hepatobiliary: No focal liver abnormality is seen. No gallstones, gallbladder wall thickening, or biliary dilatation. Pancreas: Unremarkable Spleen: Unremarkable Adrenals/Urinary Tract: Adrenal glands are unremarkable. Kidneys are normal, without renal  calculi, focal lesion, or hydronephrosis. Bladder is unremarkable. Stomach/Bowel: Stomach, small bowel, and large bowel are unremarkable. Appendix normal. No evidence of obstruction or focal inflammation. No free intraperitoneal gas or fluid. Vascular/Lymphatic: Aortic atherosclerosis. No enlarged abdominal or pelvic lymph nodes. Reproductive: Mild prostatic enlargement. Other: No abdominal wall hernia. Musculoskeletal: No acute bone abnormality. No lytic or blastic bone lesion. IMPRESSION: 1. No acute intra-abdominal pathology identified. No definite radiographic explanation for the patient's reported symptoms. 2. Mild prostatic enlargement. 3. Aortic atherosclerosis. Aortic Atherosclerosis (ICD10-I70.0). Electronically Signed   By: Helyn Numbers M.D.   On: 02/12/2022 19:31    Procedures Procedures    Medications Ordered in ED Medications  ondansetron (ZOFRAN) injection 4 mg (4 mg Intravenous Given 02/12/22 1814)  morphine (PF) 4 MG/ML injection 4 mg (4 mg Intravenous Given 02/12/22 1816)  sodium chloride 0.9 % bolus 1,000 mL (0 mLs Intravenous Stopped 02/12/22 2003)  iohexol (OMNIPAQUE) 300 MG/ML solution 100 mL (80 mLs Intravenous Contrast Given 02/12/22 1908)    ED Course/ Medical Decision Making/ A&P                           Medical Decision Making Given the large differential diagnosis for Brett Holden, the decision making in this case is of high complexity.   After evaluating all of the data points in this case, the presentation of Brett Holden does not appear emergent.  The presentation NOT consistent with an infected stone, nephric abscess, sepsis, or renal failure.  Similarly, this presentation is NOT consistent with AAA; Mesenteric Ischemia; Bowel Perforation; Bowel Obstruction; Sigmoid Volvulus; Diverticulitis; Appendicitis; Peritonitis; Cholecystitis, ascending cholangitis or other gallbladder disease; perforated ulcer; significant GI bleeding, splenic  rupture/infarction; Hepatic abscess; or other surgical/acute abdomen.  Similarly, this presentation is NOT consistent with ACS or Myocardial Ischemia; Pulmonary Embolism; fistula; incarcerated hernia; Pancreatitis, Aortic Dissection; Diabetic Ketoacidosis; Ischemic colitis; Psoas or other abscess; Methanol poisoning; Heavy metal toxicity; or porphyria.  Similarly, this presentation is NOT consistent with acute coronary syndrome, pulmonary embolism, dissection, borhaave's, arrythmia, pneumothorax, cardiac tamponade, or other emergent cardiopulmonary condition.  Similarly, this presentation is NOT consistent with pyelonephritis, urinary infection, pneumonia, or other focal bacterial infection.  Moreover this case is NOT consistent with testicular torsion, prostatitis, hernia, STI, or other testicular issue.  Strict return and follow-up precautions have been given by me personally or by detailed written instruction verbalized by nursing staff using the teach back method. to the patient/family/caregiver(s).  Data Reviewed/Counseling: I have reviwed the patient's vital signs, nursing notes, and other relevant tests/information. I had a detailed discussion regarding the historical points, exam findings, and any diagnostic results supporting the discharge diagnosis. I also discussed the need for outpatient follow-up and the need to return to the ED if symptoms worsen or if there are any questions or concerns that arise at home.  PDMP reviewed during this encounter.  Amount and/or Complexity of Data Reviewed Labs: ordered.    Details: Reviewed and evaluated Radiology: ordered and independent interpretation performed.    Details: I visualized and interpreted a ct images. No acute findings  Risk Prescription drug management.           Final Clinical Impression(s) / ED Diagnoses Final diagnoses:  Bladder pain    Rx / DC Orders ED Discharge Orders          Ordered     HYDROcodone-acetaminophen (NORCO) 5-325 MG tablet  Every 4 hours PRN        02/12/22 1955    oxybutynin (DITROPAN XL) 10 MG 24 hr tablet  Daily at bedtime        02/12/22 1955              Arthor Captain, PA-C 02/14/22 1428    Mardene Sayer, MD 02/16/22 [phone]

## 2022-02-12 NOTE — ED Triage Notes (Signed)
Pt c/o bladder pain that radiates through left flank and causing severe back pain. C/o dysuria and frequency as well. Onset ~ Saturday

## 2022-02-12 NOTE — ED Triage Notes (Signed)
"  Pain in bladder and sharp lower back pain." Started Saturday. Seen at [facility]  UA done (see results) clean.

## 2022-02-12 NOTE — ED Provider Notes (Signed)
[facility]    CSN: [id number] Arrival date & time: 02/12/22  1350      History   Chief Complaint Chief Complaint  Patient presents with   Dysuria    HPI BOLIVAR KORANDA is a 51 y.o. male.   Patient here today for evaluation of severe left lower quadrant pain, that radiates to his back.  He also reports some dysuria and urinary frequency as well.  He has not had fever.  The history is provided by the patient.    History reviewed. No pertinent past medical history.  Patient Active Problem List   Diagnosis Date Noted   Unspecified mood (affective) disorder (HCC) 11/27/2021   Anxiety and depression 09/13/2021    History reviewed. No pertinent surgical history.     Home Medications    Prior to Admission medications   Medication Sig Start Date End Date Taking? Authorizing Provider  divalproex (DEPAKOTE ER) 250 MG 24 hr tablet Take 1 tablet (250 mg total) by mouth daily for 14 days, THEN 2 tablets (500 mg total) daily for 16 days. 12/30/21 01/29/22  Stasia Cavalier, MD    Family History History reviewed. No pertinent family history.  Social History Social History   Tobacco Use   Smoking status: Every Day    Packs/day: 0.50    Types: Cigarettes    Passive exposure: Current   Smokeless tobacco: Never  Vaping Use   Vaping Use: Never used  Substance Use Topics   Alcohol use: Yes    Alcohol/week: 2.0 standard drinks of alcohol    Types: 2 Cans of beer per week    Comment: Daily. Last drink: Yesterday    Drug use: Not Currently    Types: Cocaine     Allergies   Patient has no known allergies.   Review of Systems Review of Systems  Constitutional:  Negative for chills and fever.  Eyes:  Negative for discharge and redness.  Gastrointestinal:  Positive for abdominal pain.  Genitourinary:  Positive for dysuria and frequency.  Neurological:  Negative for numbness.     Physical Exam Triage Vital Signs ED Triage Vitals  Enc Vitals  Group     BP      Pulse      Resp      Temp      Temp src      SpO2      Weight      Height      Head Circumference      Peak Flow      Pain Score      Pain Loc      Pain Edu?      Excl. in GC?    No data found.  Updated Vital Signs BP 121/75 (BP Location: Right Arm)   Pulse 68   Temp 98.2 F (36.8 C) (Oral)   Resp 16   SpO2 98%   Physical Exam Vitals and nursing note reviewed.  Constitutional:      Comments: Appears to be uncomfortable, sitting bent over on exam table  HENT:     Head: Normocephalic and atraumatic.  Eyes:     Conjunctiva/sclera: Conjunctivae normal.  Cardiovascular:     Rate and Rhythm: Normal rate.  Pulmonary:     Effort: Pulmonary effort is normal. No respiratory distress.  Neurological:     Mental Status: He is alert.  Psychiatric:        Mood and Affect: Mood normal.  Behavior: Behavior normal.        Thought Content: Thought content normal.      UC Treatments / Results  Labs (all labs ordered are listed, but only abnormal results are displayed) Labs Reviewed  POCT URINALYSIS DIP (MANUAL ENTRY)    EKG   Radiology No results found.  Procedures Procedures (including critical care time)  Medications Ordered in UC Medications - No data to display  Initial Impression / Assessment and Plan / UC Course  I have reviewed the triage vital signs and the nursing notes.  Pertinent labs & imaging results that were available during my care of the patient were reviewed by me and considered in my medical decision making (see chart for details).    UA without concerning findings.  Given unknown etiology of pain and severity of same encouraged patient to report to the emergency department for stat labs and imaging.  Patient is agreeable and has someone to drive him in the waiting room here.  Final Clinical Impressions(s) / UC Diagnoses   Final diagnoses:  LLQ pain     Discharge Instructions       Please report to    [facility] [place] [id number]     ED Prescriptions   None    PDMP not reviewed this encounter.   Francene Finders, PA-C 02/12/22 [phone]

## 2022-02-20 ENCOUNTER — Ambulatory Visit (HOSPITAL_COMMUNITY): Payer: Commercial Managed Care - HMO | Admitting: Licensed Clinical Social Worker

## 2022-04-15 NOTE — Progress Notes (Unsigned)
New Patient Note  RE: Brett Holden MRN: [id number] DOB: 11-04-1970 Date of Office Visit: 04/16/2022  Consult requested by: Camillia Herter, NP Primary care provider: Camillia Herter, NP  Chief Complaint: Urticaria (Pt c/o of breaking out in hive all over body at any giving time x 1 year benadryl for symptoms.) and Establish Care  History of Present Illness: I had the pleasure of seeing Brett Holden for initial evaluation at the [facility] on 04/16/2022. He is a 52 y.o. male, who is self-referred here for the evaluation of hives.  Rash started about 1 year ago. This can occur anywhere on his body Describes them as itchy, red, raised. Individual rashes lasts about less than 1 hour. No ecchymosis upon resolution. Associated symptoms include: none.  Frequency of episodes: daily. Suspected triggers are unknown. Denies any fevers, chills, changes in medications, foods, personal care products or recent infections. He has tried the following therapies: benadryl with good benefit. Systemic steroids: none. Currently on benadryl 50mg  daily prn which makes him drowsy.  Previous work up includes: none. Previous history of rash/hives: none. Patient is up to date with the following cancer screening tests: physical exam, needs colonoscopy.  Denies recent ticks bites. Does consume red meat every other day.  He got 2 dogs about 1 year ago but he had this rash issue before the dogs.   Assessment and Plan: Brett Holden is a 52 y.o. male with: Urticaria Daily pruritic rash x 1 year. Resolves with benadryl within a few hours but it makes him tired and he works in [employer]. No triggers noted. Got 2 dogs 1 year abo but had rash beforehand. No recent tick bites. Denies changes in diet, meds, personal care products.  Unable to skin test today due to recent antihistamine intake. Keep track of episodes and take pictures.  Based on clinical history, he likely has chronic  idiopathic urticaria. Discussed with patient, that urticaria is usually caused by release of histamine by cutaneous mast cells but sometimes it is non-histamine mediated. Explained that urticaria is not always associated with allergies. In most cases, the exact etiology for urticaria can not be established and it is considered idiopathic. Start allegra (fexofenadine) 180mg  once a day and may increase to twice a day. If symptoms are not controlled or causes drowsiness let us know. May take benadryl 25mg  to 50mg  as needed for breakthrough rashes. Avoid the following potential triggers: alcohol, tight clothing, NSAIDs, hot showers and getting overheated. Get bloodwork to rule out other etiologies.  See below for proper skin care.  Hymenoptera allergy Noted ear swelling and throat tightness recently after stings. No prior work up.  Continue to avoid. For mild symptoms you can take over the counter antihistamines such as Benadryl and monitor symptoms closely. If symptoms worsen or if you have severe symptoms including breathing issues, throat closure, significant swelling, whole body hives, severe diarrhea and vomiting, lightheadedness then seek immediate medical care. Get bloodwork. If positive will send in Epipen for this.  Return in about 4 weeks (around 05/14/2022).  Meds ordered this encounter  Medications   fexofenadine (ALLEGRA ALLERGY) 180 MG tablet    Sig: Take 1 tablet (180 mg total) by mouth in the morning and at bedtime. For hives.    Dispense:  60 tablet    Refill:  2   Lab Orders         Allergen Hymenoptera Panel         Allergens w/Total IgE  Area 2         Alpha-Gal Panel         ANA w/Reflex         C3 and C4         CBC with Differential/Platelet         Chronic Urticaria         Comprehensive metabolic panel         C-reactive protein         Thyroid Cascade Profile         Tryptase         Sedimentation rate         Food Allergy Profile      Other allergy  screening: Asthma: no Rhino conjunctivitis: no Food allergy: no Medication allergy: no Hymenoptera allergy:  Ear swelling and throat tightness. No prior work up.  Eczema:no History of recurrent infections suggestive of immunodeficency: no  Diagnostics: Skin Testing: Deferred due to recent antihistamines use.  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 No pertinent past medical history. Past Surgical History: History reviewed. No pertinent surgical history. Medication List:  Current Outpatient Medications  Medication Sig Dispense Refill   diphenhydrAMINE (BENADRYL) 25 mg capsule Take 25 mg by mouth every 6 (six) hours as needed for itching (2 capsule daily).     fexofenadine (ALLEGRA ALLERGY) 180 MG tablet Take 1 tablet (180 mg total) by mouth in the morning and at bedtime. For hives. 60 tablet 2   No current facility-administered medications for this visit.   Allergies: No Known Allergies Social History: Social History   Socioeconomic History   Marital status: Single    Spouse name: Not on file   Number of children: Not on file   Years of education: Not on file   Highest education level: Not on file  Occupational History   Not on file  Tobacco Use   Smoking status: Every Day    Packs/day: 0.50    Types: Cigarettes    Passive exposure: Current   Smokeless tobacco: Never  Vaping Use   Vaping Use: Never used  Substance and Sexual Activity   Alcohol use: Yes    Alcohol/week: 2.0 standard drinks of alcohol    Types: 2 Cans of beer per week    Comment: Daily. Last drink: Yesterday    Drug use: Not Currently    Types: Cocaine, Marijuana   Sexual activity: Yes  Other Topics Concern   Not on file  Social History Narrative   Not on file   Social Determinants of  Health   Financial Resource Strain: Not on file  Food Insecurity: Not on file  Transportation Needs: Not on file  Physical Activity: Not on file  Stress: Not on file  Social Connections: Not on file   Lives in a house which was built in 1978. Smoking: 1/2 pack per day Occupation: Engineer, manufacturing systems History: Water Damage/mildew in the house: no Carpet in the family room: no Carpet in the bedroom: no Heating: gas Cooling: central Pet: yes 2 dogs x 1 yr  Family History: History reviewed. No pertinent family history. Problem                               Relation Asthma  no Eczema                                no Food allergy                          no Allergic rhino conjunctivitis     no  Review of Systems  Constitutional:  Negative for appetite change, chills, fever and unexpected weight change.  HENT:  Negative for congestion and rhinorrhea.   Eyes:  Negative for itching.  Respiratory:  Negative for cough, chest tightness, shortness of breath and wheezing.   Cardiovascular:  Negative for chest pain.  Gastrointestinal:  Negative for abdominal pain.  Genitourinary:  Negative for difficulty urinating.  Skin:  Positive for rash.  Neurological:  Negative for headaches.    Objective: BP 110/88   Pulse 75   Temp 98.4 F (36.9 C)   Resp 16   Ht 5' 10.47" (1.79 m)   Wt 174 lb (78.9 kg)   SpO2 98%   BMI 24.63 kg/m  Body mass index is 24.63 kg/m. Physical Exam Vitals and nursing note reviewed.  Constitutional:      Appearance: Normal appearance. He is well-developed.  HENT:     Head: Normocephalic and atraumatic.     Right Ear: Tympanic membrane and external ear normal.     Left Ear: Tympanic membrane and external ear normal.     Nose: Nose normal.     Mouth/Throat:     Mouth: Mucous membranes are moist.     Pharynx: Oropharynx is clear.  Eyes:     Conjunctiva/sclera: Conjunctivae normal.  Cardiovascular:     Rate and  Rhythm: Normal rate and regular rhythm.     Heart sounds: Normal heart sounds. No murmur heard.    No friction rub. No gallop.  Pulmonary:     Effort: Pulmonary effort is normal.     Breath sounds: Normal breath sounds. No wheezing, rhonchi or rales.  Musculoskeletal:     Cervical back: Neck supple.  Skin:    General: Skin is warm.     Findings: No rash.  Neurological:     Mental Status: He is alert and oriented to person, place, and time.  Psychiatric:        Behavior: Behavior normal.    The plan was reviewed with the patient/family, and all questions/concerned were addressed.  It was my pleasure to see Brett Holden today and participate in his care. Please feel free to contact me with any questions or concerns.  Sincerely,  Rexene Alberts, DO Allergy & Immunology  [facility] office: [place] office: [phone]

## 2022-04-16 ENCOUNTER — Ambulatory Visit (INDEPENDENT_AMBULATORY_CARE_PROVIDER_SITE_OTHER): Payer: Commercial Managed Care - HMO | Admitting: Allergy

## 2022-04-16 ENCOUNTER — Other Ambulatory Visit: Payer: Self-pay

## 2022-04-16 ENCOUNTER — Encounter: Payer: Self-pay | Admitting: Allergy

## 2022-04-16 VITALS — BP 110/88 | HR 75 | Temp 98.4°F | Resp 16 | Ht 70.47 in | Wt 174.0 lb

## 2022-04-16 DIAGNOSIS — J310 Chronic rhinitis: Secondary | ICD-10-CM

## 2022-04-16 DIAGNOSIS — L509 Urticaria, unspecified: Secondary | ICD-10-CM | POA: Diagnosis not present

## 2022-04-16 DIAGNOSIS — Z91038 Other insect allergy status: Secondary | ICD-10-CM | POA: Diagnosis not present

## 2022-04-16 DIAGNOSIS — L299 Pruritus, unspecified: Secondary | ICD-10-CM | POA: Diagnosis not present

## 2022-04-16 MED ORDER — FEXOFENADINE HCL 180 MG PO TABS: 180.0000 mg | ORAL_TABLET | Freq: Two times a day (BID) | ORAL | 2 refills | Status: AC

## 2022-04-16 NOTE — Patient Instructions (Addendum)
Rash:  Keep track of episodes and take pictures.  Based on clinical history, he likely has chronic idiopathic urticaria. Discussed with patient, that urticaria is usually caused by release of histamine by cutaneous mast cells but sometimes it is non-histamine mediated. Explained that urticaria is not always associated with allergies. In most cases, the exact etiology for urticaria can not be established and it is considered idiopathic.  Start allegra (fexofenadine) 180mg  once a day and may increase to twice a day. If symptoms are not controlled or causes drowsiness let us know. May take benadryl 25mg  to 50mg  as needed for breakthrough rashes. Avoid the following potential triggers: alcohol, tight clothing, NSAIDs, hot showers and getting overheated. Get bloodwork to rule out other etiologies.  We are ordering labs, so please allow 1-2 weeks for the results to come back. With the newly implemented Cures Act, the labs might be visible to you at the same time that they become visible to me. However, I will not address the results until all of the results are back, so please be patient.   See below for proper skin care.  Bee stings Continue to avoid. For mild symptoms you can take over the counter antihistamines such as Benadryl and monitor symptoms closely. If symptoms worsen or if you have severe symptoms including breathing issues, throat closure, significant swelling, whole body hives, severe diarrhea and vomiting, lightheadedness then seek immediate medical care. Get bloodwork. If positive will send in Epipen for this.  Follow up in 1 month or sooner if needed.  Skin care recommendations  Bath time: Always use lukewarm water. AVOID very hot or cold water. Keep bathing time to 5-10 minutes. Do NOT use bubble bath. Use a mild soap and use just enough to wash the dirty areas. Do NOT scrub skin vigorously.  After bathing, pat dry your skin with a towel. Do NOT rub or scrub the  skin.  Moisturizers and prescriptions:  ALWAYS apply moisturizers immediately after bathing (within 3 minutes). This helps to lock-in moisture. Use the moisturizer several times a day over the whole body. Good summer moisturizers include: Aveeno, CeraVe, Cetaphil. Good winter moisturizers include: Aquaphor, Vaseline, Cerave, Cetaphil, Eucerin, Vanicream. When using moisturizers along with medications, the moisturizer should be applied about one hour after applying the medication to prevent diluting effect of the medication or moisturize around where you applied the medications. When not using medications, the moisturizer can be continued twice daily as maintenance.  Laundry and clothing: Avoid laundry products with added color or perfumes. Use unscented hypo-allergenic laundry products such as Tide free, Cheer free & gentle, and All free and clear.  If the skin still seems dry or sensitive, you can try double-rinsing the clothes. Avoid tight or scratchy clothing such as wool. Do not use fabric softeners or dyer sheets.

## 2022-04-16 NOTE — Assessment & Plan Note (Signed)
Daily pruritic rash x 1 year. Resolves with benadryl within a few hours but it makes him tired and he works in [employer]. No triggers noted. Got 2 dogs 1 year abo but had rash beforehand. No recent tick bites. Denies changes in diet, meds, personal care products.  Unable to skin test today due to recent antihistamine intake. Keep track of episodes and take pictures.  Based on clinical history, he likely has chronic idiopathic urticaria. Discussed with patient, that urticaria is usually caused by release of histamine by cutaneous mast cells but sometimes it is non-histamine mediated. Explained that urticaria is not always associated with allergies. In most cases, the exact etiology for urticaria can not be established and it is considered idiopathic. Start allegra (fexofenadine) 180mg  once a day and may increase to twice a day. If symptoms are not controlled or causes drowsiness let us know. May take benadryl 25mg  to 50mg  as needed for breakthrough rashes. Avoid the following potential triggers: alcohol, tight clothing, NSAIDs, hot showers and getting overheated. Get bloodwork to rule out other etiologies.  See below for proper skin care.

## 2022-04-16 NOTE — Assessment & Plan Note (Signed)
Noted ear swelling and throat tightness recently after stings. No prior work up.  Continue to avoid. For mild symptoms you can take over the counter antihistamines such as Benadryl and monitor symptoms closely. If symptoms worsen or if you have severe symptoms including breathing issues, throat closure, significant swelling, whole body hives, severe diarrhea and vomiting, lightheadedness then seek immediate medical care. Get bloodwork. If positive will send in Epipen for this.

## 2022-04-23 LAB — ALLERGEN HYMENOPTERA PANEL
Bumblebee: <0.1 kU/L
Honeybee IgE: <0.1 kU/L
Hornet, White Face, IgE: 0.68 kU/L — AB
Hornet, Yellow, IgE: <0.1 kU/L
Paper Wasp IgE: 1.36 kU/L — AB
Yellow Jacket, IgE: 1.68 kU/L — AB

## 2022-04-23 LAB — ALLERGENS W/TOTAL IGE AREA 2

## 2022-04-23 LAB — CBC WITH DIFFERENTIAL/PLATELET
Basophils Absolute: 0.1 10*3/uL (ref 0.0–0.2)
Basos: 1 %
EOS (ABSOLUTE): 0.2 10*3/uL (ref 0.0–0.4)
Eos: 2 %
Hematocrit: 41 % (ref 37.5–51.0)
Hemoglobin: 14.4 g/dL (ref 13.0–17.7)
Immature Grans (Abs): 0 10*3/uL (ref 0.0–0.1)
Immature Granulocytes: 0 %
Lymphocytes Absolute: 2.6 10*3/uL (ref 0.7–3.1)
Lymphs: 32 %
MCH: 33.3 pg — ABNORMAL HIGH (ref 26.6–33.0)
MCHC: 35.1 g/dL (ref 31.5–35.7)
MCV: 95 fL (ref 79–97)
Monocytes Absolute: 0.9 10*3/uL (ref 0.1–0.9)
Monocytes: 11 %
Neutrophils Absolute: 4.4 10*3/uL (ref 1.4–7.0)
Neutrophils: 54 %
Platelets: 357 10*3/uL (ref 150–450)
RBC: 4.32 x10E6/uL (ref 4.14–5.80)
RDW: 11.8 % (ref 11.6–15.4)
WBC: 8.1 10*3/uL (ref 3.4–10.8)

## 2022-04-23 LAB — COMPREHENSIVE METABOLIC PANEL
ALT: 14 IU/L (ref 0–44)
AST: 21 IU/L (ref 0–40)
Albumin/Globulin Ratio: 1.8 (ref 1.2–2.2)
Albumin: 4.6 g/dL (ref 3.8–4.9)
Alkaline Phosphatase: 69 IU/L (ref 44–121)
BUN/Creatinine Ratio: 14 (ref 9–20)
BUN: 12 mg/dL (ref 6–24)
Bilirubin Total: <0.2 mg/dL (ref 0.0–1.2)
CO2: 22 mmol/L (ref 20–29)
Calcium: 9.8 mg/dL (ref 8.7–10.2)
Chloride: 100 mmol/L (ref 96–106)
Creatinine, Ser: 0.84 mg/dL (ref 0.76–1.27)
Globulin, Total: 2.5 g/dL (ref 1.5–4.5)
Glucose: 83 mg/dL (ref 70–99)
Potassium: 4.5 mmol/L (ref 3.5–5.2)
Sodium: 139 mmol/L (ref 134–144)
Total Protein: 7.1 g/dL (ref 6.0–8.5)
eGFR: 106 mL/min/{1.73_m2} (ref >59)

## 2022-04-23 LAB — SEDIMENTATION RATE: Sed Rate: 43 mm/hr — ABNORMAL HIGH (ref 0–30)

## 2022-04-23 LAB — ALPHA-GAL PANEL
Allergen Lamb IgE: <0.1 kU/L
Beef IgE: <0.1 kU/L
IgE (Immunoglobulin E), Serum: 155 IU/mL (ref 6–495)
O215-IgE Alpha-Gal: <0.1 kU/L
Pork IgE: <0.1 kU/L

## 2022-04-23 LAB — FOOD ALLERGY PROFILE
Allergen Corn, IgE: <0.1 kU/L
Clam IgE: <0.1 kU/L
Codfish IgE: <0.1 kU/L
Egg White IgE: <0.1 kU/L
Milk IgE: <0.1 kU/L
Peanut IgE: <0.1 kU/L
Scallop IgE: <0.1 kU/L
Sesame Seed IgE: <0.1 kU/L
Shrimp IgE: <0.1 kU/L
Soybean IgE: <0.1 kU/L
Walnut IgE: <0.1 kU/L
Wheat IgE: <0.1 kU/L

## 2022-04-23 LAB — THYROID CASCADE PROFILE: TSH: 1.47 u[IU]/mL (ref 0.450–4.500)

## 2022-04-23 LAB — TRYPTASE: Tryptase: 4.7 ug/L (ref 2.2–13.2)

## 2022-04-23 LAB — ANA W/REFLEX: Anti Nuclear Antibody (ANA): NEGATIVE

## 2022-04-23 LAB — C-REACTIVE PROTEIN: CRP: 4 mg/L (ref 0–10)

## 2022-04-23 LAB — CHRONIC URTICARIA: cu index: 4.4 (ref <10)

## 2022-04-23 LAB — C3 AND C4
Complement C3, Serum: 143 mg/dL (ref 82–167)
Complement C4, Serum: 30 mg/dL (ref 12–38)

## 2022-04-23 MED ORDER — EPINEPHRINE 0.3 MG/0.3ML IJ SOAJ: 0.3000 mg | INTRAMUSCULAR | 1 refills | Status: AC | PRN

## 2022-04-23 NOTE — Addendum Note (Signed)
Addended by: Garnet Sierras on: 04/23/2022 08:57 AM   Modules accepted: Orders
